# Patient Record
Sex: Male | Born: 1945 | Race: White | Hispanic: No | Smoking: Current every day smoker
Health system: Western US, Community
[De-identification: ages and names within clinical notes are randomized; demographics above are authoritative.]

## PROBLEM LIST (undated history)

## (undated) LAB — HM COLONOSCOPY

## (undated) LAB — HM PSA

---

## 2013-03-11 ENCOUNTER — Inpatient Hospital Stay: Payer: MEDICARE | Attending: DO | Primary: Family

## 2013-03-11 DIAGNOSIS — E119 Type 2 diabetes mellitus without complications: Secondary | ICD-10-CM

## 2013-03-11 LAB — CBC WITH AUTO DIFFERENTIAL
Basophils %: 1 % (ref 0–2)
Basophils, Absolute: 0.1 10*3/uL (ref 0–0.2)
Eosinophils %: 1 % (ref 0–7)
Eosinophils, Absolute: 0.1 10*3/uL (ref 0–0.7)
HCT: 46.5 % (ref 42.0–54.0)
Hgb: 15.9 gm/dL (ref 14.0–18.0)
Lymphocytes %: 42 % (ref 25–45)
Lymphocytes, Absolute: 4 10*3/uL (ref 1.1–4.3)
MCH: 30.5 pg (ref 27.0–34.0)
MCHC: 34.2 gm/dL (ref 32.0–36.0)
MCV: 89.2 fL (ref 81.0–99.0)
MPV: 9.3 fL (ref 7.4–10.4)
Monocytes %: 8 % (ref 0–12)
Monocytes, Absolute: 0.8 10*3/uL (ref 0–1.2)
Neutrophils, Absolute: 4.6 10*3/uL (ref 1.6–7.3)
Platelet Count: 146 10*3/uL — ABNORMAL LOW (ref 150–400)
RBC: 5.21 10*6/uL (ref 4.70–6.10)
RDW: 13.5 % (ref 11.5–14.5)
Segs (Neutrophils)%: 48 % (ref 35–70)
WBC: 9.5 10*3/uL (ref 4.8–10.8)

## 2013-03-11 LAB — COMPREHENSIVE METABOLIC PANEL
ALT - Alanine Amino transferase: 17 IU/L (ref 5–35)
AST - Aspartate Aminotransferase: 17 IU/L (ref 5–37)
Albumin/Globulin Ratio: 2 (ref >0.9)
Albumin: 4.5 gm/dL (ref 3.5–5.5)
Alkaline Phosphatase: 39 IU/L (ref 39–117)
Anion Gap: 7.5 mmol/L (ref 3–11)
BUN: 18 mg/dL (ref 6–26)
Bilirubin, Total: 0.7 mg/dL (ref 0.2–1.2)
CO2 - Carbon Dioxide: 28.5 mmol/L (ref 22–34)
Calcium: 9.9 mg/dL (ref 8.5–10.5)
Chloride: 103 mmol/L (ref 99–111)
Creatinine, Serum: 1.06 mg/dL (ref 0.60–1.30)
GFR Estimate: >60 mL/min/{1.73_m2} (ref >60)
Globulin: 2.3 gm/dL (ref 2.2–3.7)
Glucose: 125 mg/dL — ABNORMAL HIGH (ref 80–99)
Potassium: 4.8 mmol/L (ref 3.5–5.3)
Protein, Total: 6.8 gm/dL (ref 6.0–8.0)
Sodium: 139 mmol/L (ref 136–148)

## 2013-03-11 LAB — CORONARY RISK LIPID PANEL REFLEX DIRECT LDL
Cholesterol (CRISK): 110 mg/dL (ref <200)
Cholesterol, HDL: 38 mg/dL — ABNORMAL LOW (ref 40–60)
LDL Calculated: 54 mg/dL (ref <100)
Triglycerides (Crisk): 91 mg/dL (ref <150)

## 2013-03-11 LAB — PSA, TOTAL SCREENING: PSA Screening (Hybritech): 0.88 ng/mL (ref 0.00–4.00)

## 2013-04-10 ENCOUNTER — Inpatient Hospital Stay: Payer: MEDICARE | Attending: DO | Primary: Family

## 2013-04-10 DIAGNOSIS — E785 Hyperlipidemia, unspecified: Secondary | ICD-10-CM

## 2013-04-10 LAB — GLYCO-HEMOGLOBIN A1C
Estimated Average Glucose: 134 mg/dL
Hemoglobin A1C: 6.3 % — ABNORMAL HIGH (ref 4.3–6.1)

## 2013-04-10 LAB — 25-OH HYDROXY VITAMIN D, CALCIFEROL, TOTAL OF D2 & D3: Vit D, 25-Dihydroxy D2: 31 ng/mL (ref 30–100)

## 2013-04-10 LAB — TSH: TSH - Thyroid Stimulating Hormone: 1.58 microIU/mL (ref 0.34–5.60)

## 2013-04-17 ENCOUNTER — Inpatient Hospital Stay: Admit: 2013-04-17 | Payer: MEDICARE | Attending: DO | Primary: Family

## 2013-04-17 DIAGNOSIS — I509 Heart failure, unspecified: Secondary | ICD-10-CM

## 2013-04-22 ENCOUNTER — Inpatient Hospital Stay: Admit: 2013-04-22 | Payer: MEDICARE | Attending: DO | Primary: Family

## 2013-04-22 DIAGNOSIS — I209 Angina pectoris, unspecified: Secondary | ICD-10-CM

## 2013-04-22 MED ORDER — regadenoson (LEXISCAN) syringe 0.4 mg
0.4 | Freq: Once | INTRAVENOUS | Status: AC
Start: 2013-04-22 — End: 2013-04-23
  Administered 2013-04-23: 15:00:00 0.4 mg via INTRAVENOUS

## 2013-04-22 MED ORDER — technetium TC 99M sestamibi (CARDIOLITE) injection 26.6 milli Curie
Freq: Once | Status: AC
Start: 2013-04-22 — End: 2013-04-22
  Administered 2013-04-22: 15:00:00 via INTRAVENOUS

## 2013-04-22 MED FILL — LEXISCAN 0.4 MG/5 ML INTRAVENOUS SYRINGE: 0.4 | INTRAVENOUS | Qty: 5 | Fill #0

## 2013-04-23 ENCOUNTER — Inpatient Hospital Stay: Payer: MEDICARE | Attending: DO | Primary: Family

## 2013-04-23 MED ORDER — technetium TC 99M sestamibi (CARDIOLITE) injection 26.4 milli Curie
Freq: Once | Status: AC
Start: 2013-04-23 — End: 2013-04-23
  Administered 2013-04-23: 15:00:00 via INTRAVENOUS

## 2013-04-24 ENCOUNTER — Inpatient Hospital Stay: Payer: MEDICARE | Attending: DO | Primary: Family

## 2013-08-26 DIAGNOSIS — E785 Hyperlipidemia, unspecified: Secondary | ICD-10-CM

## 2013-08-26 LAB — GLYCO-HEMOGLOBIN A1C
Estimated Average Glucose: 140 mg/dL
Hemoglobin A1C: 6.5 % — ABNORMAL HIGH (ref 4.3–6.1)

## 2013-08-27 ENCOUNTER — Inpatient Hospital Stay: Payer: MEDICARE | Attending: DO | Primary: Family

## 2015-05-11 ENCOUNTER — Other Ambulatory Visit: Admit: 2015-05-11 | Discharge: 2015-05-11 | Payer: MEDICARE | Primary: Family

## 2015-05-11 DIAGNOSIS — I2510 Atherosclerotic heart disease of native coronary artery without angina pectoris: Secondary | ICD-10-CM

## 2015-05-11 LAB — BASIC METABOLIC PANEL
Anion Gap: 8.6 mmol/L (ref 3–11)
BUN: 14 mg/dL (ref 6.0–23.0)
CO2 - Carbon Dioxide: 25.4 mmol/L (ref 21–31)
Calcium: 9.6 mg/dL (ref 8.6–10.3)
Chloride: 103 mmol/L (ref 98–111)
Creatinine, Serum: 1.11 mg/dL (ref 0.65–1.30)
GFR Estimate: >60 mL/min/{1.73_m2} (ref >60)
Glucose: 130 mg/dL — ABNORMAL HIGH (ref 80–99)
Potassium: 3.9 mmol/L (ref 3.5–5.1)
Sodium: 137 mmol/L (ref 135–143)

## 2015-05-11 LAB — CBC WITH AUTO DIFFERENTIAL
Basophils %: 1 % (ref 0–2)
Basophils, Absolute: 0.1 10*3/uL (ref 0–0.2)
Eosinophils %: 1 % (ref 0–7)
Eosinophils, Absolute: 0.1 10*3/uL (ref 0–0.7)
HCT: 46 % (ref 42.0–54.0)
Hgb: 15.8 gm/dL (ref 12.0–18.0)
Lymphocytes %: 40 % (ref 25–45)
Lymphocytes, Absolute: 3.9 10*3/uL (ref 1.1–4.3)
MCH: 30.2 pg (ref 27–34)
MCHC: 34.2 gm/dL (ref 32–36)
MCV: 88.3 fL (ref 81–99)
MPV: 9.2 fL (ref 7.4–10.4)
Monocytes %: 7 % (ref 0–12)
Monocytes, Absolute: 0.7 10*3/uL (ref 0–1.2)
Neutrophils, Absolute: 5 10*3/uL (ref 1.6–7.3)
Platelet Count: 167 10*3/uL (ref 150–400)
RBC: 5.21 10*6/uL (ref 4.70–6.10)
RDW: 13.3 % (ref 11.5–14.5)
Segs (Neutrophils)%: 51 % (ref 35–70)
WBC: 9.7 10*3/uL (ref 4.8–10.8)

## 2015-05-12 NOTE — H&P (Signed)
[name], [name]    Date of visit:  05/11/2015  DOB:  1946/06/17    Age:  69 yrs.  Medical record number:  [id number]      Account number:  [id number]    ____________________________  CLINICAL RESUME  1.  Coronary artery disease.        a.  Non-ST elevation myocardial infarction, December 2007, at the [facility] with cardiac catheterization showing 90% lesion in the mid left circumflex, treated with 2.5 x 16 mm Taxus stent, on clopidogrel until 2014.       b.  Myocardial perfusion study, April 23, 2013, normal perfusion with ejection fraction  of 66% and septal hypokinesis, normal TID ratio.       c.  Transthoracic echocardiogram performed April 17, 2013 showing concentric left ventricular hypertrophy with left ventricular ejection fraction of 70%.       d.  Transthoracic echocardiogram performed February 19, 2015 at [facility] showing normal left ventricular systolic function, LVEF 65%, asymmetric septal hypertrophy with a septal wall thickness of 1.5 and a posterior wall thickness of 1.3.  2.  Hyperlipidemia, on rosuvastatin.  3.  Tobacco abuse.  4.  Hypertension, uncontrolled.  5.  Gastroesophageal reflux disease.  6.  Diabetes mellitus.  ____________________________  ALLERGIES   IVP Dye, Iodine Containing, Intolerance-unknown  ____________________________  MEDICATIONS   1. Nitroglycerin 0.4 Mg Sublingual Tablet, as needed, for chest pain x3 doses   2. Amlodipine 10 Mg Tablet, 1 by mouth daily   3. Losartan 100 Mg Tablet, 1 by mouth daily   4. aspirin 81 mg tablet,delayed release, 1 by mouth daily   5. metformin 1,000 mg tablet, 1 by mouth twice daily   6. pantoprazole 40 mg tablet,delayed release, 1 by mouth daily   7. rosuvastatin 20 mg tablet, 1 by mouth in the evening   8. prednisone 20 mg tablet, Take 60mg  the night before and morning of procedure.  ____________________________  CHIEF COMPLAINTS   Followup of typical angina  ____________________________  HISTORY OF PRESENT ILLNESS  I had the pleasure of seeing  Charles [name] back in clinic today.  As you know, he is a pleasant 69 year old car enthusiast from [facility], who is a retired IT trainer and presents for evaluation of coronary artery disease and accelerating angina.  I last saw him on October 29, 2014 at which time he was having no symptoms of chest pain.  He was having significant symptoms of fatigue and had been taken off of his metoprolol.  His blood pressure was under good control of which hypertension had resulted in chest pain previously.    He called into our clinic on August 18th as he was having exertional chest pain and shortness of breath.  His symptoms come on with walking one to one and a half minutes.  He has had to moderate all of his activities due to the discomfort.  His symptoms are very similar to what he had back in December of 2007 at which time he had a 90% lesion in his mid left circumflex which was treated with a drug-eluting stent.    He has a prior history of IV contrast allergy.  He states that during his first cath back in 2007, he had an episode of hypotension during the procedure; however, it was unclear whether this was contrast related.  He had two subsequent cardiac catheterizations, both of which did not result in the same symptoms.  He is not clear if he was premedicated or not.  He continues to smoke one pack per day.  His blood pressure has continued to be mildly elevated on amlodipine and losartan.  He was told on July 15th to begin taking carvedilol; however, he never started this medication.  ____________________________  PAST HISTORY   Past Medical Illnesses:  DM, hyperlipidemia, hypertension-benign, GERD, stroke;  Cardiovascular Illnesses:  chest pain, MI;  Surgical Procedures:  tonsillectomy, eye surgery;  Cardiology Procedures-Invasive:  Stent;  Left Ventricular Ejection Fraction:  LVEF of 65% documented via echocardiogram on 02/19/2015  ;       _____________________________  FAMILY HISTORY  Father -- Myocardial  infarction at less than 60 (Hx of stroke, died of MI)  Mother -- Heart disorder (pace maker implanted)  ____________________________  CARDIAC RISK FACTORS   Tobacco Abuse: positive, currently smoking;  Family History of Heart Disease: positive;  Hyperlipidemia: positive;  Hypertension: positive;  Diabetes Mellitus: positive;  Prior History of Heart Disease: positive;  Obesity: negative, BMI<25 (Normal);  Sedentary Life Style: sedentary lifestyle;  Age: positive;  Menopausal: negative  ____________________________  SOCIAL HISTORY  Alcohol Use:  denies drinking;  Smoking:  smokes 1 ppd for 52 yrs;  Diet:  caffeine use 2-3 per day and diabetic diet;  Lifestyle:  divorced;  Exercise:  no regular exercise;  Occupation:  retired enjoys fishing;  Illicit Drug Use:  denies substance abuse;  Residence:  lives with male partner;    ____________________________  REVIEW OF SYSTEMS  General:  fatigue  Integumentary:  denies recent rashes.  Eyes:  denies new blind spots, double vision.  Ears, Nose, Throat, Mouth:  denies difficulty swallowing, nosebleeds.  Respiratory:  denies dry tickling cough, wheezing.  Cardiovascular:  please review HPI  Abdominal:  denies nausea, vomiting, abdominal pain, blood in stools.  Genitourinary-Male:  difficulty with erections  Musculoskeletal:  muscle aches  Neurological:  headaches, weakness  Psychiatric:  denies any recent history of depression.  Endocrine:  denies polydipsia.  Hematological/Immunologic:  denies excessive bleeding or bruising.  ____________________________  PHYSICAL EXAMINATION    VITAL SIGNS   Blood Pressure:    144/86 Sitting, Left arm, regular cuff    132/80 Standing, Left arm, regular cuff     Pulse:  103/min.   Weight:  187.00 lbs.   Height:  70   BMI: 27      Constitutional:  alert, oriented  Skin:  warm and dry to touch  Head:  normocephalic  Eyes:  EOMS Intact, conjunctivae and lids unremarkable  ENT:  ears unremarkable, throat clear  Neck:  no JVD  Chest:   Bilaterally clear to auscultation. No crackles or wheezing.  Cardiac:  regular rhythm, S1 normal, S2 normal, No S3 or S4, apical impulse not displaced, no murmurs, no rubs detected  Abdomen:  abdomen soft, bowel sounds normoactive, no masses, non-tender, no bruits  Peripheral Pulses:  pulses full and equal in all extremities  Extremities & Back:  no clubbing, no cyanosis, no erythema, no edema, normal muscle strength and tone  Psychiatric:  appropriate mood, memory and judgment  Neurological:  no gross motor or sensory deficits noted  ____________________________  DIAGNOSTIC STUDIES  EKG performed today and reviewed by myself shows normal sinus rhythm with a heart rate of 103.  There are no pathologic Q-waves, no ST or T-wave changes.  Laboratory studies performed March 27, 2015, at the [place], total cholesterol 133, HDL 45, LDL 69, triglycerides 95.  ____________________________  IMPRESSIONS/PLAN  1.  Accelerating angina with a history of coronary artery disease.  Given that he is at high risk for recurrent coronary obstruction, I believe proceeding with a coronary angiogram is preferable to performing cardiovascular stress testing.  He plans to perform the procedure in [place] through the right wrist later this week.  If this confirms coronary obstruction then plan for percutaneous revascularization.  He will continue on aspirin, losartan and rosuvastatin.  He also was recommended to restart his metoprolol.  He was previously on 50 mg twice daily; he should start on 25 mg twice daily.  He was given instructions for presenting to the emergency room should he have chest pain which is nonresolving with nitroglycerin.  2.  Hypertension, borderline elevated today.  Recommend continuation of his amlodipine and losartan with re-addition of metoprolol.  3.  Hyperlipidemia.  Continue simvastatin.  4.  Tobacco abuse.  Recommend cessation.    It was a pleasure to see Charles [name] in clinic today.  If you have further questions  or concerns, please do not hesitate to contact me.  ____________________________  Christianne Dolin  Allergy Review  Medications Reconciliation                           Referring Physician: Homero Fellers, DAVID        Glendale Chard, MD, [facility]  NF / ieep      This H&P Note was originally documented in a third party system and entered into Epic by Collier Salina on behalf of Glendale Chard, MD of [facility].

## 2015-05-14 LAB — PROTIME-INR
INR: 1.1 (ref 0.9–1.2)
Protime: 12.4 s (ref 10.0–13.0)

## 2015-05-14 LAB — POCT GLUCOSE: POC Glucose: 183 mg/dL — ABNORMAL HIGH (ref 80–99)

## 2015-05-14 MED ORDER — midazolam (VERSED) injection
1 | INTRAMUSCULAR | Status: AC | PRN
Start: 2015-05-14 — End: 2015-05-14
  Administered 2015-05-14: 19:00:00 1 mg/mL via INTRAVENOUS

## 2015-05-14 MED ORDER — acetaminophen (TYLENOL) tablet 650 mg
325 | ORAL | Status: DC | PRN
Start: 2015-05-14 — End: 2015-05-14

## 2015-05-14 MED ORDER — sodium chloride (NS) 0.9% bolus 250 mL
INTRAVENOUS | Status: DC | PRN
Start: 2015-05-14 — End: 2015-05-14

## 2015-05-14 MED ORDER — metoprolol (LOPRESSOR) 50 MG tablet
50 | ORAL_TABLET | Freq: Two times a day (BID) | ORAL | 1.00 refills | 90.00000 days | Status: DC
Start: 2015-05-14 — End: 2015-11-27

## 2015-05-14 MED ORDER — sodium chloride 0.9 % (NS) syringe 5 mL
Freq: Three times a day (TID) | INTRAMUSCULAR | Status: DC
Start: 2015-05-14 — End: 2015-05-14

## 2015-05-14 MED ORDER — fentaNYL (PF) (SUBLIMAZE) 50 mcg/mL injection
50 | INTRAMUSCULAR | Status: AC | PRN
Start: 2015-05-14 — End: 2015-05-14
  Administered 2015-05-14: 19:00:00 50 mcg/mL via INTRAVENOUS

## 2015-05-14 MED ORDER — heparin in NS infusion 2000 unit/1000 mL
2000 | INTRAVENOUS | Status: AC | PRN
Start: 2015-05-14 — End: 2015-05-14
  Administered 2015-05-14: 19:00:00

## 2015-05-14 MED ORDER — midazolam (PF) (VERSED) 1 mg/mL injection
1 | INTRAMUSCULAR | Status: AC
Start: 2015-05-14 — End: ?

## 2015-05-14 MED ORDER — diphenhydrAMINE (BENADRYL) capsule 25 mg
25 | ORAL | Status: DC
Start: 2015-05-14 — End: 2015-05-14

## 2015-05-14 MED ORDER — lidocaine (PF) 1 % (XYLOCAINE) injection
10 | INTRAMUSCULAR | Status: AC | PRN
Start: 2015-05-14 — End: 2015-05-14
  Administered 2015-05-14: 19:00:00 10 mg/mL (1 %) via SUBCUTANEOUS

## 2015-05-14 MED ORDER — heparin (porcine) 1,000 unit/mL injection
1000 | INTRAMUSCULAR | Status: AC
Start: 2015-05-14 — End: ?

## 2015-05-14 MED ORDER — heparin (porcine) injection
1000 | INTRAMUSCULAR | Status: AC | PRN
Start: 2015-05-14 — End: 2015-05-14
  Administered 2015-05-14: 19:00:00 via INTRAVENOUS

## 2015-05-14 MED ORDER — verapamil (ISOPTIN) 2.5 mg/mL injection
2.5 | INTRAVENOUS | Status: AC
Start: 2015-05-14 — End: ?

## 2015-05-14 MED ORDER — losartan (COZAAR) 100 MG tablet
100 | ORAL_TABLET | Freq: Every day | ORAL | Status: DC
Start: 2015-05-14 — End: 2015-11-27

## 2015-05-14 MED ORDER — iodixanol (VISIPAQUE) 320 mg iodine/mL injection
320 | INTRAVENOUS | Status: AC | PRN
Start: 2015-05-14 — End: 2015-05-14
  Administered 2015-05-14: 19:00:00 320 mg iodine/mL via INTRAVENOUS

## 2015-05-14 MED ORDER — nitroglycerin syringe 2 mg/10mL (200 mcg/mL)
200 | INTRAVENOUS | Status: AC | PRN
Start: 2015-05-14 — End: 2015-05-14
  Administered 2015-05-14: 19:00:00 2000 mcg/10 mL via INTRAVENOUS

## 2015-05-14 MED ORDER — sodium chloride 0.9 % (NS) syringe 5 mL
INTRAMUSCULAR | Status: DC | PRN
Start: 2015-05-14 — End: 2015-05-14

## 2015-05-14 MED ORDER — isosorbide mononitrate (IMDUR) 30 MG 24 hr tablet
30 | ORAL_TABLET | Freq: Every day | ORAL | 1.00 refills | 90.00000 days | Status: DC
Start: 2015-05-14 — End: 2015-11-27

## 2015-05-14 MED ORDER — nitroglycerin (NITROSTAT) SL tablet 0.4 mg
0.4 | SUBLINGUAL | Status: DC | PRN
Start: 2015-05-14 — End: 2015-05-14

## 2015-05-14 MED ORDER — heparin in NS infusion 2 unit/mL
1000 | INTRAVENOUS | Status: AC | PRN
Start: 2015-05-14 — End: 2015-05-14
  Administered 2015-05-14: 19:00:00

## 2015-05-14 MED ORDER — fentaNYL (PF) (SUBLIMAZE) 50 mcg/mL injection
50 | INTRAMUSCULAR | Status: AC
Start: 2015-05-14 — End: ?

## 2015-05-14 MED ORDER — aspirin 81 MG chewable tablet 81 mg
81 | Freq: Every day | ORAL | Status: DC
Start: 2015-05-14 — End: 2015-05-14
  Administered 2015-05-14: 19:00:00 81 mg via ORAL

## 2015-05-14 MED ORDER — atropine syringe 0.5 mg
0.1 | INTRAMUSCULAR | Status: DC | PRN
Start: 2015-05-14 — End: 2015-05-14

## 2015-05-14 MED ORDER — nitroglycerin (TRIDIL) 50 mg/250 mL (200 mcg/mL) infusion
50 | INTRAVENOUS | Status: AC
Start: 2015-05-14 — End: ?

## 2015-05-14 MED ORDER — HYDROcodone-acetaminophen (NORCO) 5-325 mg per tablet 1-2 tablet
5-325 | ORAL | Status: DC | PRN
Start: 2015-05-14 — End: 2015-05-14

## 2015-05-14 MED FILL — NITROGLYCERIN 50 MG/250 ML (200 MCG/ML) IN 5 % DEXTROSE INTRAVENOUS: 50 mg/250 mL (200 mcg/mL) | INTRAVENOUS | Qty: 250

## 2015-05-14 MED FILL — MIDAZOLAM (PF) 1 MG/ML INJECTION SOLUTION: 1 mg/mL | INTRAMUSCULAR | Qty: 1

## 2015-05-14 MED FILL — FENTANYL (PF) 50 MCG/ML INJECTION SOLUTION: 50 ug/mL | INTRAMUSCULAR | Qty: 2

## 2015-05-14 MED FILL — HEPARIN (PORCINE) 1,000 UNIT/ML INJECTION VIAL: 1000 [IU]/mL | INTRAMUSCULAR | Qty: 1

## 2015-05-14 MED FILL — VERAPAMIL 2.5 MG/ML INTRAVENOUS SOLUTION: 2.5 mg/mL | INTRAVENOUS | Qty: 2

## 2015-05-14 MED FILL — ASPIRIN 81 MG CHEWABLE TABLET: 81 mg | ORAL | Qty: 1

## 2015-05-14 NOTE — Progress Notes (Signed)
Patient is tolerating lunch meal well.  No complaints of nausea or vomiting.  Right radial site continues to be intact and dry with no new signs or symptoms of bleeding.  Denies pain.

## 2015-05-14 NOTE — Progress Notes (Signed)
Patient down to procedure on monitor transported by Rossie Muskrat., RN.  In no apparent respiratory distress.  Able to converse appropriately and follows simple commands.

## 2015-05-14 NOTE — Progress Notes (Signed)
Patient given post-procedure instructions with significant other, [name], at bedside. Both verbalized understanding of instructions with teach back and state they have no further questions. AVS packet given to patient and [name] to review.

## 2015-05-14 NOTE — Brief Op Note (Signed)
Cardiology Brief Operative Note    Performing Cardiologist and Assistants:  Nurse - Registered Nurse  Tech - Scrub Tech  Tech - Monitor Cath Lab  Physician - Cardiologist  Physician - Cardiologist  Nurse - Registered Nurse  [facility] - Monitor Cath Lab  Tech - Scrub Tech [place], [place] A.  Luna Kitchens, Annastasia  [place], [address]  [place], Harrold Donath  [place], Shawn A.  Malen Gauze, Franklyn Lor  Ebony Cargo     Pre-Op Diagnosis: Coronary Artery Disease (C.A.D.) and Chest Pain    Post-Op Diagnosis:  Coronary Artery Disease (C.A.D.)    Procedure Performed:  Left Heart Catheterization and Coronary Angiography    Specimen(s) Removed:  None    Estimated Blood Loss: Less than 30 cc    Anesthesia Type:: None    Implant:: None    Findings:  80% D2 small vessel, OM1 70%, RPDA mid 70%, patent mid LCX stent, LVEF 80%    Complications:  None    Disposition:  CVR - In stable condition

## 2015-05-14 NOTE — Progress Notes (Signed)
Patient resting quietly in bed.  No signs or symptoms of active bleeding at compression site.

## 2015-05-14 NOTE — Other (Signed)
SBAR report given to marian rn

## 2015-05-14 NOTE — Discharge Instructions (Signed)
Coronary Angiogram  A coronary angiogram, also called coronary angiography, is an X-ray procedure used to look at the arteries in the heart. In this procedure, a dye (contrast dye) is injected through a long, hollow tube (catheter). The catheter is about the size of a piece of cooked spaghetti and is inserted through your groin, wrist, or arm. The dye is injected into each artery, and X-rays are then taken to show if there is a blockage in the arteries of your heart.  LET [facility] CARE PROVIDER KNOW ABOUT:  ? Any allergies you have, including allergies to shellfish or contrast dye. ?  ? All medicines you are taking, including vitamins, herbs, eye drops, creams, and over-the-counter medicines. ?  ? Previous problems you or members of your family have had with the use of anesthetics. ?  ? Any blood disorders you have. ?  ? Previous surgeries you have had.  ? History of kidney problems or failure. ?  ? Other medical conditions you have.  RISKS AND COMPLICATIONS   Generally, a coronary angiogram is a safe procedure. However, problems can occur and include:  ? Allergic reaction to the dye.  ? Bleeding from the access site or other locations.  ? Kidney injury, especially in people with impaired kidney function.?  ? Stroke (rare).  ? Heart attack (rare).  BEFORE THE PROCEDURE   ? Do not eat or drink anything after midnight the night before the procedure or as directed by your health care provider. ?  ? Ask your health care provider about changing or stopping your regular medicines. This is especially important if you are taking diabetes medicines or blood thinners.  PROCEDURE  ? You may be given a medicine to help you relax (sedative) before the procedure. This medicine is given through an intravenous (IV) access tube that is inserted into one of your veins. ?  ? The area where the catheter will be inserted will be washed and shaved. This is usually done in the groin but may be done in the fold of your arm (near your  elbow) or in the wrist. ??  ? A medicine will be given to numb the area where the catheter will be inserted (local anesthetic). ?  ? The health care provider will insert the catheter into an artery. The catheter will be guided by using a special type of X-ray (fluoroscopy) of the blood vessel being examined. ?  ? A special dye will then be injected into the catheter, and X-rays will be taken. The dye will help to show where any narrowing or blockages are located in the heart arteries. ?  AFTER THE PROCEDURE   ? If the procedure is done through the leg, you will be kept in bed lying flat for several hours. You will be instructed to not bend or cross your legs.  ? The insertion site will be checked frequently. ?  ? The pulse in your feet or wrist will be checked frequently. ?  ? Additional blood tests, X-rays, and an electrocardiogram may be done. ?    - Do not lift anything heavier than a grocery bag for several days.  - No heavy use of right arm/wrist for several days.  - Seek medical attention if heavy bleeding of the site occurs.  - Seek medical attention if any signs of infection, such as oozing from the site or fever/chills  Document Released: 03/12/2003 Document Revised: 01/20/2014 Document Reviewed: 01/28/2013  ExitCare? Patient Information ?2015 [employer]. This  information is not intended to replace advice given to you by your health care provider. Make sure you discuss any questions you have with your health care provider.

## 2015-08-27 ENCOUNTER — Ambulatory Visit: Admit: 2015-08-27 | Discharge: 2015-08-27 | Payer: TRICARE (CHAMPUS) | Attending: MD | Primary: Family

## 2015-08-27 DIAGNOSIS — Z6826 Body mass index (BMI) 26.0-26.9, adult: Secondary | ICD-10-CM

## 2015-08-27 NOTE — Progress Notes (Deleted)
Charles [name] is a 69 y.o. male.   No chief complaint on file.      HISTORY OF PRESENT ILLNESS   HPI  ***    No problems updated.  Past Medical History   Diagnosis Date   . Angina at rest    . Arrhythmia      years ago   . Bronchitis    . Coronary artery disease    . Diabetes mellitus 2013   . GERD (gastroesophageal reflux disease)      for years   . Hyperlipidemia    . Hypertension 2006   . NSTEMI (non-ST elevated myocardial infarction) 08/2006 with stent mid left circumflex   . Stroke 1980's     Past Surgical History   Procedure Laterality Date   . Coronary stent placement Left mid circumflex     08/2006   . Cardiac catheterization     . Eye surger       Tear duct surgery   . Tonsillectomy       as a child     Allergies   Allergen Reactions   . Gabapentin Other (See Comments)     Yellowing of skin.   . Iodine And Iodide Containing Products Anaphylaxis     CONTRAST DYE used years ago for heart cath     Social History     Social History Main Topics   . Smoking status: Current Every Day Smoker     Packs/day: 1.00     Years: 52.00   . Smokeless tobacco: Former Neurosurgeon      Comment: Dr. Alfred Levins discussed smoking cessation with visit.   Marland Kitchen Alcohol use No   . Drug use: No   . Sexual activity: Yes     Partners: Female     Social History   . Marital status: Significant Other     Spouse name: N/A   . Number of children: 3   . Years of education: N/A     Other Topics Concern   . Exercise Yes     a little     Family History   Problem Relation Age of Onset   . Heart attack Mother    . Heart disease Mother    . Stroke Mother    . Heart disease Father          REVIEW OF SYSTEMS   Review of Systems    PHYSICAL EXAM   There were no vitals taken for this visit.  There is no height or weight on file to calculate BMI.  Physical Exam      ASSESSMENT & PLAN     Problem List Items Addressed This Visit     None      Visit Diagnoses     BMI 26.0-26.9,adult    -  Primary            Current Outpatient Prescriptions:   .  amLODIPine (NORVASC)  10 MG tablet, Take 10 mg by mouth daily., Disp: , Rfl:   .  aspirin 81 MG EC tablet, Take 81 mg by mouth daily., Disp: , Rfl:   .  diphenhydrAMINE (BENADRYL) 50 MG capsule, Take 50 mg by mouth daily. Take night before procedure, Disp: , Rfl:   .  isosorbide mononitrate (IMDUR) 30 MG 24 hr tablet, Take 1 tablet (30 mg total) by mouth daily., Disp: 30 tablet, Rfl: 11  .  losartan (COZAAR) 100 MG tablet, Take 1 tablet (100 mg total) by mouth  daily., Disp: 30 tablet, Rfl: 11  .  metFORMIN (GLUCOPHAGE) 1000 MG tablet, Take 1,000 mg by mouth 2 (two) times daily with meals., Disp: , Rfl:   .  metoprolol (LOPRESSOR) 50 MG tablet, Take 1 tablet (50 mg total) by mouth 2 (two) times daily., Disp: 60 tablet, Rfl: 11  .  nitroglycerin (NITROSTAT) 0.4 MG SL tablet, Place 0.4 mg under the tongue every 5 (five) minutes as needed for Chest pain., Disp: , Rfl:   .  pantoprazole (PROTONIX) 40 MG tablet, Take 40 mg by mouth daily., Disp: , Rfl:   .  rosuvastatin (CRESTOR) 20 MG tablet, Take 20 mg by mouth nightly. , Disp: , Rfl:     This note was scribed by *** on behalf of Gerrit Heck, MD and all contents have been reviewed prior to signature of the progress note.
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
## 2015-09-16 ENCOUNTER — Encounter: Admit: 2015-09-16 | Discharge: 2015-09-17 | Payer: MEDICARE | Primary: Family

## 2015-09-16 ENCOUNTER — Ambulatory Visit: Admit: 2015-09-16 | Discharge: 2015-09-17 | Payer: TRICARE (CHAMPUS) | Attending: Adult Health | Primary: Family

## 2015-09-16 DIAGNOSIS — J189 Pneumonia, unspecified organism: Secondary | ICD-10-CM

## 2015-09-16 MED ORDER — ipratropium-albuterol (DUO-NEB) 0.5 mg-3 mg(2.5 mg base)/3 mL nebulizer solution 3 mL
0.5 | Freq: Once | RESPIRATORY_TRACT | Status: AC
Start: 2015-09-16 — End: 2015-09-16
  Administered 2015-09-16: 23:00:00 0.5 mL via RESPIRATORY_TRACT

## 2015-09-16 MED ORDER — levoFLOXacin (LEVAQUIN) 500 MG tablet
500 | ORAL_TABLET | Freq: Every day | ORAL | 0 refills | 5.00000 days | Status: DC
Start: 2015-09-16 — End: 2015-09-23

## 2015-09-16 MED ORDER — methylPREDNISolone (MEDROL DOSEPACK) 4 mg tablet
4 | ORAL_TABLET | ORAL | 0 refills | Status: DC
Start: 2015-09-16 — End: 2015-09-23

## 2015-09-16 NOTE — Telephone Encounter (Signed)
Patient is calling and states he feels like he is only breathing out of the top half of his lungs. Patient states he feels like hew has fluid in his lungs. He says he is having flu-like symptoms. Breathing is short and rapid and said you threw up and it was almost all nasal discharge. Patient says these symptoms started on Monday 12/26. Please call back at [phone]

## 2015-09-16 NOTE — Telephone Encounter (Signed)
Pt states he is SOB, has a dry cough, feels like he has fluid in his lungs and that he is only breathing out of the top of his lungs x 2 days. He said he may have a fever as well. A loose cough is heard over the phone. He denies chest pain, pedal edema, no hx of CHF, no Rx'd diuretics. He speaks in full sentences however does appear slightly SOB. He would like to be seen. Dr. Miquel Dunn is booked. Scheduled pt to see Pamella Pert DNP at 1500 and instructed him to check in at 1455. He stated understanding and agreement with this. Discussed with Pamella Pert and closing encounter.

## 2015-09-16 NOTE — Progress Notes (Addendum)
Charles [name] is a 69 y.o. male.  Chief Complaint   Charles [name] presents with   . Cough     with SOB, and congestion x3 days         SUBJECTIVE     Chief Complaint   Charles [name] presents with   . Cough     with SOB, and congestion x3 days       HISTORY OF PRESENT ILLNESS     HPI      Cough  This is a acute problem that started about 3 day(s) ago. Charles [name] reports that this problem is constant. Charles [name] states that this problem has worsened since its onset. She states that Charles cough is not productive. Cough is made worse by being too warm. Charles [name] reports having tried turning his heating pad down helps a little. Attempted treatments provided Musinex DM that gives him a lot of cough relief. Associated symptoms include chills, fever, postnasal drip, shortness of breath and wheezing. Charles [name] denies any: change in voice, chest pain, sputum production and weight loss.      Charles following portions of Charles Charles [name]'s history were reviewed and updated as appropriate: allergies, current medications, past family history, past medical history, past surgical histroy and problem list.    Outpatient Prescriptions Marked as Taking for Charles 09/16/15 encounter (Office Visit) with Pamella Pert, ANP   Medication Sig Dispense Refill   . amLODIPine (NORVASC) 10 MG tablet Take 10 mg by mouth daily.     Marland Kitchen aspirin 81 MG EC tablet Take 81 mg by mouth daily.     . diphenhydrAMINE (BENADRYL) 50 MG capsule Take 50 mg by mouth daily. Take night before procedure     . isosorbide mononitrate (IMDUR) 30 MG 24 hr tablet Take 1 tablet (30 mg total) by mouth daily. 30 tablet 11   . losartan (COZAAR) 100 MG tablet Take 1 tablet (100 mg total) by mouth daily. 30 tablet 11   . metFORMIN (GLUCOPHAGE) 1000 MG tablet Take 1,000 mg by mouth 2 (two) times daily with meals.     . metoprolol (LOPRESSOR) 50 MG tablet Take 1 tablet (50 mg total) by mouth 2 (two) times daily. 60 tablet 11   . nitroglycerin (NITROSTAT) 0.4 MG SL tablet Place 0.4 mg under Charles tongue every 5 (five)  minutes as needed for Chest pain.     . pantoprazole (PROTONIX) 40 MG tablet Take 40 mg by mouth daily.     . rosuvastatin (CRESTOR) 20 MG tablet Take 20 mg by mouth nightly.        Current Facility-Administered Medications for Charles 09/16/15 encounter (Office Visit) with Pamella Pert, ANP   Medication Dose Route Frequency Provider Last Rate Last Dose   . [COMPLETED] ipratropium-[name] (DUO-NEB) 0.5 mg-3 mg(2.5 mg base)/3 mL nebulizer solution 3 mL  3 mL Nebulization Once Tida Saner, ANP   3 mL at 09/16/15 1513       Past Medical History   Diagnosis Date   . Angina at rest    . Arrhythmia      years ago   . Bronchitis    . Coronary artery disease    . Diabetes mellitus 2013   . GERD (gastroesophageal reflux disease)      for years   . Hyperlipidemia    . Hypertension 2006   . NSTEMI (non-ST elevated myocardial infarction) 08/2006 with stent mid left circumflex   . Stroke 1980's       Past Surgical History   Procedure Laterality  Date   . Coronary stent placement Left mid circumflex     08/2006   . Cardiac catheterization     . Eye surger       Tear duct surgery   . Tonsillectomy       as a child       Family History   Problem Relation Age of Onset   . Heart attack Mother    . Heart disease Mother    . Stroke Mother    . Heart disease Father        Social History     Social History Main Topics   . Smoking status: Current Every Day Smoker     Packs/day: 1.00     Years: 52.00   . Smokeless tobacco: Former Neurosurgeon      Comment: Dr. Alfred Levins discussed smoking cessation with visit.   Marland Kitchen Alcohol use No   . Drug use: No   . Sexual activity: Yes     Partners: Female     Social History   . Marital status: Significant Other     Spouse name: N/A   . Number of children: 3   . Years of education: N/A     Other Topics Concern   . Exercise Yes     a little       REVIEW OF SYSTEMS     Please review HPI for clinically relevant ROS.    OBJECTIVE     Visit Vitals   . BP 102/64   . Pulse 77   . Temp 36.7 ?C (98.1 ?F) (Oral)   . Ht 5' 10 (1.778  m)   . Wt 189 lb (85.7 kg)   . SpO2 (!) 89%   . BMI 27.12 kg/m2     Body mass index is 27.12 kg/(m^2).     Nebulizer Treatment  Charles [name] had a SaO2 of 89, Peak Flow of 200 and Lung Sounds that were diminished.  After Charles [name] 2.5 mg / 3 mL vial  DuoNeb 3 mL vial via nebulizer.  After Charles [name] had a SaO2 of 92, Peak Flow of 200 and Lung Sounds that were diminished. Charles [name] improved in his oxygen saturation with treatment but not his lung volumes.      Inserted 22g IV into Right hand. Charles [name] tolerated well. No signs of infiltration noted. Infused 500L of fluid over 2 hours with 2 grams of Rocephin. Conclusion of infusion IV catheter removed patent and without complications. Charles [name] vitals signs stable.     IV start time:3:30pm  IV end time :5 :30pm    Administrations This Visit     cefTRIAXone (ROCEPHIN) 2 g in dextrose 5 % (D5W) 50 mL IVPB     Admin Date Action Dose Rate Route Site Administered By       09/16/2015  04:30 New Bag 2 g 100 mL/hr Intravenous Right Hand Willeen Niece, MA      Ordering Provider:  Pamella Pert, ANP     NDC:  [phone]     Manufacturer:  [employer]IT[employer]     Charles [name]?:  No                 ipratropium-[name] (DUO-NEB) 0.5 mg-3 mg(2.5 mg base)/3 mL nebulizer solution 3 mL     Admin Date Action Dose Route Site Administered By          09/16/2015  15:13 Given 3 mL Nebulization Mouth E[place]  [place], [place]       Ordering Provider:  Pamella Pert, ANP     NDC:  9562-1308-65     Lot#:  [id number]     Manufacturer:  [employer]     Charles [name]?:  No                 sodium chloride 0.9 % infusion     Admin Date Action Dose Rate Route Site Administered By       09/16/2015  16:10 New Bag   250 mL/hr Intravenous Left Hand Willeen Niece, MA      Ordering Provider:  Pamella Pert, ANP     NDC:  2952-8413-24     Lot#:  [id number]     Manufacturer:  [employer]     Charles [name]?:  No                           PHYSICAL EXAM     Physical Exam      Constitutional: Charles [name] is oriented to person, place, and time and well-developed, well-nourished, and in no distress.   HENT:   Head: Normocephalic and atraumatic.   Right Ear: External ear normal.   Left Ear: External ear normal.   Nose: Nose normal.   Mouth/Throat: Oropharynx is clear and moist.   Eyes: Conjunctivae and EOM are normal. Pupils are equal, round, and reactive to light.   Neck: Normal range of motion. Neck supple.   Cardiovascular: Normal rate, regular rhythm, normal heart sounds and intact distal pulses.    Pulmonary/Chest: Charles [name] is in respiratory distress. Charles [name] has wheezes.   Diminished lung sounds posteriorly, borderline pnuemonia   Abdominal: Soft. Bowel sounds are normal.   Musculoskeletal: Normal range of motion.   Neurological: Charles [name] is alert and oriented to person, place, and time. Charles [name] has normal reflexes. Gait normal. GCS score is 15.   Skin: Skin is warm and dry.   Psychiatric: Mood, memory, affect and judgment normal.   Vitals reviewed.      ASSESSMENT & PLAN     1. Pneumonia due to organism  - Nebulizer treatment  - Peak Flow Test  - Pulse Ox  - ipratropium-[name] (DUO-NEB) 0.5 mg-3 mg(2.5 mg base)/3 mL nebulizer solution 3 mL; Take 3 mLs by nebulization once.  - levoFLOXacin (LEVAQUIN) 500 MG tablet; Take 1 tablet (500 mg total) by mouth daily for 7 days.  Dispense: 7 tablet; Refill: 0  - methylPREDNISolone (MEDROL DOSEPACK) 4 mg tablet; Follow package insert instructions  Dispense: 21 tablet; Refill: 0  - X-ray chest PA and lateral; Future    2. Former smoker, stopped smoking in distant past    3. BMI 27.0-27.9,adult    4. Overweight      Return if symptoms worsen or fail to improve.    There are no discontinued medications.      ----------------------------------------------------------------------------------------------------------------------    We had a thoughtful and careful discussion regarding Charles issues today. All questions were answered. We educated and reassured. We encouraged  followup as needed.     This note was transcribed using speech recognition software. Please contact us for clarification if any questions arise relating to Charles wording of this document.    I, Pamella Pert, DNP, personally performed Charles services described in this documentation, as scribed by Willeen Niece in my presence, and it is both accurate and complete.

## 2015-09-17 MED ORDER — cefTRIAXone (ROCEPHIN) 2 g in dextrose 5 % (D5W) 50 mL IVPB
1 | Freq: Once | INTRAMUSCULAR | Status: AC
Start: 2015-09-17 — End: 2015-09-16
  Administered 2015-09-16: 13:00:00 via INTRAVENOUS

## 2015-09-17 NOTE — Addendum Note (Signed)
Addended by: Willeen Niece on: 09/17/2015 04:14 PM     Modules accepted: Orders

## 2015-09-18 LAB — SOP PEAK FLOW: SOP Peak Flow: 250 %

## 2015-09-18 MED ORDER — sodium chloride 0.9 % infusion
Freq: Once | INTRAVENOUS | Status: AC
Start: 2015-09-18 — End: 2015-09-16
  Administered 2015-09-17: via INTRAVENOUS

## 2015-09-18 NOTE — Telephone Encounter (Signed)
Please obtain and transcribe [facility] referral.

## 2015-09-23 ENCOUNTER — Emergency Department: Admit: 2015-09-24 | Primary: Family

## 2015-09-23 DIAGNOSIS — J9601 Acute respiratory failure with hypoxia: Secondary | ICD-10-CM

## 2015-09-23 NOTE — H&P (Signed)
History and Physical   Name: Charles [name]  DOB: 1946-05-11 70 y.o.  MRN: [id number]  CSN: [id number]  ADMISSION PROVIDER: Letta Kocher Mercades Bajaj, 09/23/2015  PCP: Gerrit Heck, MD    HISTORY:   CHIEF COMPLAINT    Shortness Of Breath    HPI    Charles [name] is a 70 y.o. male with a history of CAD, type 2 diabetes, GERD, hyperlipidemia, hypertension and probable COPD status post PFT done at the [place] [facility] (results unknown) who presented to the hospital because of increasing shortness of breath. He is a very good historian. [place] with his significant other, Charles [name], and normally is 100% independent. About 10 days ago the patient began having fatigue, runny nose and a cough. He was having some postnasal drainage that he vomited twice 90 ago. After those vomiting episodes, they were nonbilious, nonbloody and simply has not per the patient, he was still was not feeling well with continued chills and then diarrhea. 7 days prior to admission the patient saw his nurse practitioner who diagnosed him with pneumonia and gave him a seven-day course of Levaquin and a 6 day course of a Medrol Dosepak. Despite this regimen, the patient continued to have increasing shortness of breath. He said he feels like he can only use about half of his lungs bilaterally. He also is having some pain when he coughs but no pleurisy. He is mostly dyspneic with exertion not so much at rest but occasionally feels short of breath at rest. He did have pulmonary function tests done at the [place] but again the results are unknown. He is having a productive cough but he absolutely cannot handle spitting and therefore does not know if it isn't discolored or simply clear phlegm. He has never had any blood clots. He seems unsure about having COPD but he has smoked essentially his entire life. When he was a very young child he is exposed to heavy secondhand smoke and he began smoking at the age of 54 and  continues to do so at this time and has no intention of quitting at this time. In the emergency department he has findings consistent with COPD exacerbation, possibly a viral infection so he is being admitted for hypoxia. At rest on his bed on room air he is 88% as checked by me.    PAST MEDICAL HISTORY    Past Medical History   Diagnosis Date   . Angina at rest    . Arrhythmia      years ago, EPISODES of vtach whole life   . COPD (chronic obstructive pulmonary disease)    . Coronary artery disease    . Diabetes mellitus 2013   . GERD (gastroesophageal reflux disease)      for years   . Hyperlipidemia    . Hypertension 2006   . NSTEMI (non-ST elevated myocardial infarction) 08/2006 with stent mid left circumflex   . Stroke 1980's     had neuro changes but resolved       SURGICAL HISTORY    Past Surgical History   Procedure Laterality Date   . Coronary stent placement Left mid circumflex     08/2006   . Cardiac catheterization     . Eye surger       Tear duct surgery   . Tonsillectomy       as a child       ALLERGIES    Gabapentin and Iodine  and iodide containing products    CURRENT MEDICATIONS    Prior to Admission Medications    Medication Dose & Frequency   benzonatate (TESSALON) 100 MG capsule Take 100 mg by mouth 3 (three) times daily as needed for Cough.   amLODIPine (NORVASC) 10 MG tablet Take 10 mg by mouth daily.   aspirin 81 MG EC tablet Take 81 mg by mouth daily.   isosorbide mononitrate (IMDUR) 30 MG 24 hr tablet Take 1 tablet (30 mg total) by mouth daily.   losartan (COZAAR) 100 MG tablet Take 1 tablet (100 mg total) by mouth daily.   metFORMIN (GLUCOPHAGE) 1000 MG tablet Take 1,000 mg by mouth 2 (two) times daily with meals.   metoprolol (LOPRESSOR) 50 MG tablet Take 1 tablet (50 mg total) by mouth 2 (two) times daily.   nitroglycerin (NITROSTAT) 0.4 MG SL tablet Place 0.4 mg under the tongue every 5 (five) minutes as needed for Chest pain.   pantoprazole (PROTONIX) 40 MG tablet Take 40 mg by mouth  daily.   rosuvastatin (CRESTOR) 20 MG tablet Take 20 mg by mouth nightly.        FAMILY HISTORY    Family History   Problem Relation Age of Onset   . Heart attack Mother    . Heart disease Mother    . Stroke Mother    . Heart disease Father        SOCIAL HISTORY    Social History     Social History Narrative    Lives with wife Baldwin [place], she is healthcare surrogate     Social History   Substance Use Topics   . Smoking status: Current Every Day Smoker     Packs/day: 1.00     Years: 52.00   . Smokeless tobacco: Former Neurosurgeon      Comment: Dr. Alfred Levins discussed smoking cessation with visit.   Marland Kitchen Alcohol use No       There is no immunization history on file for this patient.    REVIEW OF SYSTEMS    Constitutional:  Denies fever, has had chills, no weight loss  Eyes:  Denies change in visual acuity   HENT:  Denies sore throat but he has been having some runny nose/congestion  Respiratory:  (+) cough with productive sputum. (+) dyspnea, wheezing  Cardiovascular:  Denies palpitaions or edema, no pleurisy  GI:  Denies abdominal pain, bloody stools. He has had diarrhea, vomiting resolved  GU:  Denies dysuria   Musculoskeletal:  Denies back pain or joint pain   Integument:  Denies rash   Neurologic:  Denies headache, focal weakness or sensory changes   Endocrine:  Denies polyuria or polydipsia   Lymphatic:  Denies swollen glands   Psychiatric:  Denies depression or anxiety     PHYSICAL EXAM:                                               VITAL SIGNS on ADMISSION   Temp Blood Pressure Heart Rate Resp Rate O2 Sats   Temp: 36.7 ?C (98.1 ?F) BP: 156/84 Pulse: 96 Resp: 16   SpO2: (!) 89 % on  L/min  MOST RECENT VITAL SIGNS   Temp Blood Pressure Heart Rate Resp Rate O2 Sats   Temp: 36.7 ?C (98.1 ?F) BP: 150/87 Pulse: 74 Resp: 16   SpO2: 94 % on  L/min      Admission Weight: Weight: 84.8 kg (187 lb)       BMI: Body mass index is 26.83 kg/(m^2).    Constitutional:  Well developed, well nourished, no  acute distress, tired  Eyes:  PERRL, conjunctiva normal, EOMI, non-icteric  HENT:  Atraumatic, oropharynx moist, no pharyngeal exudates, dentition is good  Neck- normal range of motion, no tenderness, supple   Respiratory:  No respiratory distress, decreased breath sounds bilaterally, mild inspiratory and expiratory wheezing  Cardiovascular:  Normal rate, normal rhythm, no murmur   GI:  Soft, nondistended, decreased bowel sounds, nontender, no rebound, no guarding   GU:  No costovertebral angle tenderness   Musculoskeletal:  No edema, no tenderness, no deformities. Good pedal pulses  Integument:  Well hydrated, no rash   Lymphatic:  No lymphadenopathy noted   Neurologic:  Alert & oriented x 3, CN 2-12 normal, normal motor function,  no focal deficits noted   Psychiatric:  Speech and behavior appropriate     DATA:     LABS    Results for orders placed or performed during the hospital encounter of 09/23/15 (from the past 24 hour(s))   Lactic Acid (ED) -STAT    Collection Time: 09/23/15  7:23 PM   Result Value Ref Range    Lactic Acid ED 1.4 0.5 - 2.2 mmol/L   CBC with Auto Differential -STAT    Collection Time: 09/23/15  7:24 PM   Result Value Ref Range    WBC 10.7 4.8 - 10.8 10*3/?L    RBC 5.26 4.70 - 6.10 10*6/?L    Hemoglobin 15.4 12.0 - 18.0 g/dL    HCT 16.1 09.6 - 04.5 %    MCV 87.5 81.0 - 99.0 fL    MCH 29.3 27.0 - 34.0 pg    MCHC 33.5 32.0 - 36.0 g/dL    RDW 40.9 81.1 - 91.4 %    Platelet Count 183 150 - 400 10*3/?L    MPV 8.7 7.4 - 10.4 fL    Neutrophils % 45 35 - 70 %    Lymphocytes % 46 (H) 25 - 45 %    Monocytes % 8 0 - 12 %    Eosinophils % 0 0 - 7 %    Basophils % 1 0 - 2 %    Neutrophils, Absolute 4.8 1.6 - 7.3 10*3/?L    Lymphocytes, Absolute 4.9 (H) 1.1 - 4.3 10*3/?L    Monocytes, Absolute 0.8 0.0 - 1.2 10*3/?L    Eosinophils, Absolute 0.0 0.0 - 0.7 10*3/?L    Basophils, Absolute 0.1 0.0 - 0.2 10*3/?L    Differential Type Automated Differential    Comprehensive Metabolic Panel -STAT    Collection Time:  09/23/15  7:24 PM   Result Value Ref Range    Sodium 135 135 - 143 mmol/L    Potassium 3.9 3.5 - 5.1 mmol/L    Chloride 102 98 - 111 mmol/L    CO2 - Carbon Dioxide 23.1 21.0 - 31.0 mmol/L    Glucose 186 (H) 80 - 99 mg/dL    BUN 17 6 - 23 mg/dL    Creatinine 7.82 9.56 - 1.30 mg/dL    Calcium 9.6 8.6 - 21.3 mg/dL    AST - Aspartate Aminotransferase 27 10 - 50  IU/L    ALT - Alanine Amino transferase 50 7 - 52 IU/L    Alkaline Phosphatase 55 34 - 104 IU/L    Bilirubin Total 0.4 0.3 - 1.2 mg/dL    Protein Total 6.5 6.0 - 8.0 g/dL    Albumin 4.2 3.5 - 5.0 g/dL    Globulin 2.3 2.2 - 3.7 g/dL    Albumin/Globulin Ratio 1.8 >0.9    Anion Gap 9.9 3.0 - 11.0 mmol/L    GFR Estimate >60 >=60 mL/min/1.46m*2    GFR Additional Info     Blood Culture -x 2    Collection Time: 09/23/15  7:24 PM   Result Value Ref Range    Blood Culture Result No Growth to Date    Blood Culture -x 2    Collection Time: 09/23/15  7:27 PM   Result Value Ref Range    Blood Culture Result No Growth to Date    Rapid Influenza A & B by NAAT(Stat) -STAT    Collection Time: 09/23/15  9:01 PM   Result Value Ref Range    Influenza A Influenza A Viral RNA Not Detected Influenza A Viral RNA Not Detected    Influenza B Influenza B Viral RNA Not Detected Influenza B Viral RNA Not Detected       IMAGING   X-ray chest PA and lateral   Final Result   IMPRESSION:     1.  Hyperexpanded clear lungs. Hyperexpansion may relate to chronic obstructive    pulmonary disease if patient has a history of smoking. No superimposed acute    pneumonia identified.                ASSESSMENT:   DIAGNOSIS:         *COPD with exacerbation [J44.1]      Tobacco use disorder [F17.200]            Long history of smoking; does not want to be            counseled about quitting      Type 2 diabetes mellitus treated without insulin [E11.9]            Metformin 1000mg  twice a day; A1c 6.5 (26 August 2013)      Benign essential hypertension [I10]            Amlodipine 10mg  daily, Imdur  30mg  daily,            metoprolol 50mg  twice a day, losartan 100mg  daily;            160/98 in clinic & suspect pt did not take his             medicine (27 Aug 2015)        Pertinent Labs & Imaging studies reviewed. (See chart for details)     PLAN:   See orders.  The plan was discussed with the patient and/or family.    1. Acute respiratory failure with hypoxia secondary to COPD exacerbation: It is strange he has not really improved. He had 7 days of levaquin so I don't believe he needs further antibiotics. Will give him pulmicort, prednisone, albuterol prn and scheduled Duo-nebs. I am requesting the PFTs from the [place]. I would consider a high resolution CT if he does not improve to ensure no other underlying disease is going on. He is exposed to a lot of birds, smokes cigarettes and marijuana so he  has several issues for dyspnea. No history of PE/clots but will check ddimer  2. Type 2 diabetes: will hold metformin. Will use sliding scale insulin for now  3. Hypertension: usual meds.  4. Tobacco abuse: I counseled him to quit but he does not want to hear it.     DVT prophylaxis--heparin    Code Status--Full Code  Patient meets inpatient criteria with a LOS estimation to be greater than 2 midnighs. Patient meets intensity of service and medical necessity in that he is hypoxic. If patient were to be not to admitted potential adverse events include respiratory failure.  Total time spent 55 minutes    Electronically signed [id number] Britta Mccreedy [id number] PM  This dictation was produced using voice recognition software. Despite concurrent proofreading, please note transcription errors may be present and that these errors may not truly reflect my intent.   cc:CC Providers:    PCP  Gerrit Heck, MD  Attending  Elise Benne, MD  Admitting  Letta Kocher Kenedi Cilia, DO  Author  Letta Kocher Kennidee Heyne

## 2015-09-23 NOTE — ED Notes (Signed)
Pt resting on gur, no complaints. Seems to be breathing easier after treatments. Per pt he feels the same, states he could tell a difference if he got up and walked but doesn't feel like getting up right now. Call light and SO at bedside, pt in good spirits, pwd awake alert/nad

## 2015-09-23 NOTE — ED Provider Notes (Signed)
[facility] EMERGENCY DEPARTMENT ENCOUNTER    History and Physical     Name: Charles [name]  DOB: 11-08-1945 70 y.o.  MRN: [id number]  CSN: [id number]    HISTORY:     CHIEF COMPLAINT    Shortness of breath despite Levaquin and Prednisone     HPI    Charles [name] is a 70 y.o. male with a history of bronchitis, who presents to the ED for shortness of breath that has not improved since being placed on Levaquin and a 6 day course of Prednisone to treat pneumonia that was diagnosed by Pamella Pert, ANP, at his PCP's office a week ago. The patient reports that at that time, he had a cough with minimal sputum, rhinorrhea, shortness of breath, diffuse myalgias, 2 episodes of emesis, and diarrhea, but no fever. He has not had any episodes of vomiting since "Sunday, and his last loose stool was 2 days ago. The diarrhea proceeded his antibiotic use. The patient does not have a history of DVT or PE. No active cancer, recent surgery, lower extremity edema or hemoptysis. No history of asthma or COPD.   Upon review of the chest X-Ray on 12/28, there is no evidence of pneumonia.     PAST MEDICAL HISTORY    Past Medical History   Diagnosis Date   . Angina at rest    . Arrhythmia      years ago, EPISODES of vtach whole life   . COPD (chronic obstructive pulmonary disease)    . Coronary artery disease    . Diabetes mellitus 2013   . GERD (gastroesophageal reflux disease)      for years   . Hyperlipidemia    . Hypertension 2006   . NSTEMI (non-ST elevated myocardial infarction) 08/2006 with stent mid left circumflex   . Stroke 1980's     had neuro changes but resolved       SURGICAL HISTORY    Past Surgical History   Procedure Laterality Date   . Coronary stent placement Left mid circumflex     12" /2007   . Cardiac catheterization     . Eye surger       Tear duct surgery   . Tonsillectomy       as a child       CURRENT MEDICATIONS    Home Medications     Med List Status:  Complete Letta Kocher Hirni, DO 09/23/2015  9:56 PM           Last Dose     amLODIPine (NORVASC) 10 MG tablet 09/23/2015     Take 10 mg by mouth daily.     aspirin 81 MG EC tablet 09/23/2015     Take 81 mg by mouth daily.     benzonatate (TESSALON) 100 MG capsule      Take 100 mg by mouth 3 (three) times daily as needed for Cough.     isosorbide mononitrate (IMDUR) 30 MG 24 hr tablet 09/23/2015     Take 1 tablet (30 mg total) by mouth daily.     losartan (COZAAR) 100 MG tablet 09/23/2015     Take 1 tablet (100 mg total) by mouth daily.     metFORMIN (GLUCOPHAGE) 1000 MG tablet 09/23/2015     Take 1,000 mg by mouth 2 (two) times daily with meals.     metoprolol (LOPRESSOR) 50 MG tablet 09/23/2015     Take 1 tablet (50 mg total) by mouth 2 (two) times daily.  nitroglycerin (NITROSTAT) 0.4 MG SL tablet Unknown     Place 0.4 mg under the tongue every 5 (five) minutes as needed for Chest pain.     pantoprazole (PROTONIX) 40 MG tablet 09/23/2015     Take 40 mg by mouth daily.     rosuvastatin (CRESTOR) 20 MG tablet 09/23/2015     Take 20 mg by mouth nightly.           ALLERGIES    Gabapentin and Iodine and iodide containing products    FAMILY HISTORY    Family History   Problem Relation Age of Onset   . Heart attack Mother    . Heart disease Mother    . Stroke Mother    . Heart disease Father        SOCIAL HISTORY    Social History   Substance Use Topics   . Smoking status: Current Every Day Smoker     Packs/day: 1.00     Years: 52.00   . Smokeless tobacco: Former Neurosurgeon      Comment: Dr. Alfred Levins discussed smoking cessation with visit.   Marland Kitchen Alcohol use No     Smoking Cessation Counseling: The patient was counseled as to the multiple risks to his health from continued use of tobacco products. It was explained that continuing to smoke may lead to multiple short and long term negative health consequences, including but not limited to mouth/esophageal/lung cancer, COPD, and heart disease. He states he understands these risks, and also understands the options and resources available to him to help  him stop smoking. The total time spent counseling the patient regarding tobacco cessation was 4 minutes.     REVIEW OF SYSTEMS:     REVIEW OF SYSTEMS:  Constitutional:  Denies fever  Eyes:  Denies eye pain  HENT:  Positive rhinorrhea   Respiratory:  Positive cough productive of minimal sputum; no blood. Positive shortness of breath. No   Cardiovascular:  Denies chest pain, palpitations  GI:  Denies abdominal pain or nausea. Positive 2 episodes of vomiting; none since Sunday. Positive diarrhea, none today  Musculoskeletal:  Denies back pain.   Neurologic:  Denies headache, focal neurologic symptoms  Lymphatic:  Denies swollen glands   Psychiatric:  Denies anxiety and depression  All systems negative except as marked.       PHYSICAL EXAM:   VITAL SIGNS:    Initial Vitals   BP 09/23/15 1723 156/84   Pulse 09/23/15 1723 96   Resp 09/23/15 1723 16   Temp 09/23/15 1723 36.7 ?C (98.1 ?F)   SpO2 09/23/15 1723 89 %   Vital Signs: Reviewed the patient's vital signs.  Pulse oximetry interpretation: 89% on room air. Hypoxic.     Vital Signs: Reviewed the patient's vital signs.  Pulse oximetry interpretation: 93% on 2 liters/min via nasal cannula. Improved after treatment.      Constitutional:  No acute distress, Non-toxic appearance.   HENT:  Normocephalic, Atraumatic, Oropharynx moist  Eyes:   Sclera anicteric  Respiratory:  Diffuse wheeze, moving only fair air, but speaking in full sentences.   Cardiovascular:  Normal heart rate, Normal rhythm, No murmurs, No rubs, No gallops.   GI:  Bowel sounds normal, Soft, No tenderness  Musculoskeletal:  No lower extremity edema. No cyanosis, No clubbing.  Integument:  Warm, Dry, No erythema, No rash.   Neurologic:  Alert & oriented x 3, moves all extremities  Psychiatric:  Affect normal, Judgment normal, Mood normal.  DATA:     LABS    Results for orders placed or performed during the hospital encounter of 09/23/15 (from the past 24 hour(s))   Lactic Acid (ED) -STAT    Collection  Time: 09/23/15  7:23 PM   Result Value Ref Range    Lactic Acid ED 1.4 0.5 - 2.2 mmol/L   CBC with Auto Differential -STAT    Collection Time: 09/23/15  7:24 PM   Result Value Ref Range    WBC 10.7 4.8 - 10.8 10*3/?L    RBC 5.26 4.70 - 6.10 10*6/?L    Hemoglobin 15.4 12.0 - 18.0 g/dL    HCT 81.1 91.4 - 78.2 %    MCV 87.5 81.0 - 99.0 fL    MCH 29.3 27.0 - 34.0 pg    MCHC 33.5 32.0 - 36.0 g/dL    RDW 95.6 21.3 - 08.6 %    Platelet Count 183 150 - 400 10*3/?L    MPV 8.7 7.4 - 10.4 fL    Neutrophils % 45 35 - 70 %    Lymphocytes % 46 (H) 25 - 45 %    Monocytes % 8 0 - 12 %    Eosinophils % 0 0 - 7 %    Basophils % 1 0 - 2 %    Neutrophils, Absolute 4.8 1.6 - 7.3 10*3/?L    Lymphocytes, Absolute 4.9 (H) 1.1 - 4.3 10*3/?L    Monocytes, Absolute 0.8 0.0 - 1.2 10*3/?L    Eosinophils, Absolute 0.0 0.0 - 0.7 10*3/?L    Basophils, Absolute 0.1 0.0 - 0.2 10*3/?L    Differential Type Automated Differential    Comprehensive Metabolic Panel -STAT    Collection Time: 09/23/15  7:24 PM   Result Value Ref Range    Sodium 135 135 - 143 mmol/L    Potassium 3.9 3.5 - 5.1 mmol/L    Chloride 102 98 - 111 mmol/L    CO2 - Carbon Dioxide 23.1 21.0 - 31.0 mmol/L    Glucose 186 (H) 80 - 99 mg/dL    BUN 17 6 - 23 mg/dL    Creatinine 5.78 4.69 - 1.30 mg/dL    Calcium 9.6 8.6 - 62.9 mg/dL    AST - Aspartate Aminotransferase 27 10 - 50 IU/L    ALT - Alanine Amino transferase 50 7 - 52 IU/L    Alkaline Phosphatase 55 34 - 104 IU/L    Bilirubin Total 0.4 0.3 - 1.2 mg/dL    Protein Total 6.5 6.0 - 8.0 g/dL    Albumin 4.2 3.5 - 5.0 g/dL    Globulin 2.3 2.2 - 3.7 g/dL    Albumin/Globulin Ratio 1.8 >0.9    Anion Gap 9.9 3.0 - 11.0 mmol/L    GFR Estimate >60 >=60 mL/min/1.23m*2    GFR Additional Info     Blood Culture -x 2    Collection Time: 09/23/15  7:24 PM   Result Value Ref Range    Blood Culture Result No Growth to Date    D Dimer, Quantitative -STAT    Collection Time: 09/23/15  7:24 PM   Result Value Ref Range    D Dimer, Quantitative 507 (HCrit)  100 - 500 ng/mL FEU   Blood Culture -x 2    Collection Time: 09/23/15  7:27 PM   Result Value Ref Range    Blood Culture Result No Growth to Date    Rapid Influenza A & B by NAAT(Stat) -STAT    Collection Time: 09/23/15  9:01 PM   Result Value Ref Range    Influenza A Influenza A Viral RNA Not Detected Influenza A Viral RNA Not Detected    Influenza B Influenza B Viral RNA Not Detected Influenza B Viral RNA Not Detected       RADIOLOGY/PROCEDURES    X-ray chest PA and lateral   Final Result   IMPRESSION:     1.  Hyperexpanded clear lungs. Hyperexpansion may relate to chronic obstructive    pulmonary disease if patient has a history of smoking. No superimposed acute    pneumonia identified.              PLAN:     ED COURSE & MEDICAL DECISION MAKING    Pertinent Labs & Imaging studies reviewed. (See chart for details)     Ddx:  1. COPD   2. CHF   3. Asthma   4. Pneumonia  5. Bronchitis  6. Pneumothorax  7. Pulmonary Embolism  8. Acute Coronary Syndrome  9. Influenza     Plan: Check laboratory evidence, Chest X-Ray, Influenza culture, Nebulizer breathing treatments, Treat with Solumedrol, Reassess, Plan on admission    This patient presents with a viral illness, to negative chest x-rays in the last week and continued shortness of breath and hypoxemia. While he does not have a formal history of COPD his chest x-ray suggests she has COPD and he continues to smoke. A shunt reported runny nose muscle aches in addition to his cough productive of yellow phlegm. He has no PE risk factors and his d-dimer is age appropriate. He is not having any chest pain or evidence of acute coronary syndrome. His flu studies are negative but he has an influenza-like condition. There is no evidence of congestive heart failure or other cause of the hypoxemia and shortness of breath. I doubt an exotic cause of continued wheezing and shortness of breath despite the fact that he is around birds.    FINAL IMPRESSION    1. Acute upper respiratory  infection with viral illness  2. COPD exacerbation        MD Scribe Attestation:  All medical record entries made by the Scribe were at my direction and personally dictated by me, Dr. Elise Benne, MD. I have reviewed the chart and agree that the record accurately reflects my personal performance of the history, physical exam, assessment, and plan. I have also personally directed, reviewed, and agree with the discharge instructions.    Scribe Attestation:  Documented by [employer], acting as a scribe for Dr. Elise Benne, MD.     This document has been created using voice recognition software.  Please be aware that the software may not accurately reflect the intent of dictating physician when reading this note.        Elise Benne, MD  09/23/15 [phone]

## 2015-09-23 NOTE — Telephone Encounter (Signed)
Pt is needing Triaged. Pt has been sick and does not feel better has pneumonia.

## 2015-09-23 NOTE — ED Notes (Signed)
O2 2LPM via NC brisk return to 92%.

## 2015-09-23 NOTE — Telephone Encounter (Signed)
Was alerted to patient's situation by [facility]. Spoke with patient. He states he is feeling worse and feels that he cannot catch his breath. He is speaking in 3 word sentences and is audibly having trouble breathing. Advised patient to go to ED as he has pneumonia and worsening SOB. He states he has a ride and will not drive himself. Patient verbalized understanding, closing encounter.

## 2015-09-23 NOTE — ED Triage Notes (Signed)
Patient saw his MD last Wednesday and diagnosed with pneumonia and started on Levaquin. Patient has not improved and is SOB.

## 2015-09-23 NOTE — ED Provider Notes (Signed)
I saw this patient in my role as the ED provider at triage.  As such I did limited history and physical exam to confirm the patient's triage level and order appropriate studies   (if any) to begin the patient's workup. The patient understands that further evaluation will be done when they are brought back to an ED room.      Chief complaint: Worsening SOB with recent Dx and outpt Tx with Levaquin and prednisone for Pneumonia. Presents with 88-89% SaO2 on RA. Dry cough reported.     Pertinent findings: BS decreased in bases. Placed on O2 at 2 L/M    Plan: Chest xray, CBC, CMP. BC x 2. Lactic.      Lorre Nick, [place]  09/23/15 1734

## 2015-09-24 ENCOUNTER — Encounter: Payer: MEDICARE | Attending: Adult Health | Primary: Family

## 2015-09-24 ENCOUNTER — Inpatient Hospital Stay
Admission: EM | Admit: 2015-09-24 | Discharge: 2015-09-26 | Disposition: A | Discharge status: Home / Self Care | Admitting: MD

## 2015-09-24 ENCOUNTER — Inpatient Hospital Stay: Admit: 2015-09-24 | Primary: Family

## 2015-09-24 LAB — CBC WITH AUTO DIFFERENTIAL
Basophils %: 1 % (ref 0–2)
Basophils %: 0 % (ref 0–2)
Basophils, Absolute: 0.1 10*3/??L (ref 0.0–0.2)
Basophils, Absolute: 0 10*3/ÂµL (ref 0.0–0.2)
Eosinophils %: 0 % (ref 0–7)
Eosinophils %: 0 % (ref 0–7)
Eosinophils, Absolute: 0 10*3/??L (ref 0.0–0.7)
Eosinophils, Absolute: 0 10*3/ÂµL (ref 0.0–0.7)
HCT: 46.1 % (ref 42.0–54.0)
HCT: 41.8 % — ABNORMAL LOW (ref 42.0–54.0)
Hemoglobin: 15.4 g/dL (ref 12.0–18.0)
Hemoglobin: 14.5 g/dL (ref 12.0–18.0)
Lymphocytes %: 46 % — ABNORMAL HIGH (ref 25–45)
Lymphocytes %: 32 % (ref 25–45)
Lymphocytes, Absolute: 4.9 10*3/??L — ABNORMAL HIGH (ref 1.1–4.3)
Lymphocytes, Absolute: 2 10*3/ÂµL (ref 1.1–4.3)
MCH: 29.3 pg (ref 27.0–34.0)
MCH: 30.2 pg (ref 27.0–34.0)
MCHC: 33.5 g/dL (ref 32.0–36.0)
MCHC: 34.6 g/dL (ref 32.0–36.0)
MCV: 87.5 fL (ref 81.0–99.0)
MCV: 87.3 fL (ref 81.0–99.0)
MPV: 8.7 fL (ref 7.4–10.4)
MPV: 8.8 fL (ref 7.4–10.4)
Monocytes %: 8 % (ref 0–12)
Monocytes %: 2 % (ref 0–12)
Monocytes, Absolute: 0.8 10*3/??L (ref 0.0–1.2)
Monocytes, Absolute: 0.2 10*3/ÂµL (ref 0.0–1.2)
Neutrophils %: 45 % (ref 35–70)
Neutrophils %: 66 % (ref 35–70)
Neutrophils, Absolute: 4.8 10*3/??L (ref 1.6–7.3)
Neutrophils, Absolute: 4.1 10*3/ÂµL (ref 1.6–7.3)
Platelet Count: 183 10*3/??L (ref 150–400)
Platelet Count: 164 10*3/??L (ref 150–400)
RBC: 5.26 10*6/??L (ref 4.70–6.10)
RBC: 4.79 10*6/ÂµL (ref 4.70–6.10)
RDW: 13.3 % (ref 11.5–14.5)
RDW: 13.1 % (ref 11.5–14.5)
WBC: 10.7 10*3/??L (ref 4.8–10.8)
WBC: 6.3 10*3/ÂµL (ref 4.8–10.8)

## 2015-09-24 LAB — COMPREHENSIVE METABOLIC PANEL
ALT - Alanine Aminotransferase: 50 IU/L (ref 7–52)
AST - Aspartate Aminotransferase: 27 IU/L (ref 10–50)
Albumin/Globulin Ratio: 1.8 (ref >0.9)
Albumin: 4.2 g/dL (ref 3.5–5.0)
Alkaline Phosphatase: 55 IU/L (ref 34–104)
Anion Gap: 9.9 mmol/L (ref 3.0–11.0)
BUN: 17 mg/dL (ref 6–23)
Bilirubin Total: 0.4 mg/dL (ref 0.3–1.2)
CO2 - Carbon Dioxide: 23.1 mmol/L (ref 21.0–31.0)
Calcium: 9.6 mg/dL (ref 8.6–10.3)
Chloride: 102 mmol/L (ref 98–111)
Creatinine: 1.01 mg/dL (ref 0.65–1.30)
GFR Estimate: >60 mL/min/{1.73_m2} (ref >=60)
Globulin: 2.3 g/dL (ref 2.2–3.7)
Glucose: 186 mg/dL — ABNORMAL HIGH (ref 80–99)
Potassium: 3.9 mmol/L (ref 3.5–5.1)
Protein Total: 6.5 g/dL (ref 6.0–8.0)
Sodium: 135 mmol/L (ref 135–143)

## 2015-09-24 LAB — BASIC METABOLIC PANEL
Anion Gap: 7.6 mmol/L (ref 3.0–11.0)
BUN: 13 mg/dL (ref 6–23)
CO2 - Carbon Dioxide: 22.4 mmol/L (ref 21.0–31.0)
Calcium: 8.4 mg/dL — ABNORMAL LOW (ref 8.6–10.3)
Chloride: 108 mmol/L (ref 98–111)
Creatinine: 0.71 mg/dL (ref 0.65–1.30)
GFR Estimate: >60 mL/min/{1.73_m2} (ref >=60)
Glucose: 169 mg/dL — ABNORMAL HIGH (ref 80–99)
Potassium: 4.2 mmol/L (ref 3.5–5.1)
Sodium: 138 mmol/L (ref 135–143)

## 2015-09-24 LAB — LACTIC ACID ED: Lactic Acid ED: 1.4 mmol/L (ref 0.5–2.2)

## 2015-09-24 LAB — D-DIMER, QUANTITATIVE: D Dimer, Quantitative: 507 ng/mL FEU (ref 100–500)

## 2015-09-24 LAB — INFLUENZA A & B
Influenza A: NOT DETECTED
Influenza B: NOT DETECTED

## 2015-09-24 LAB — POCT GLUCOSE
POC Glucose: 171 mg/dL — ABNORMAL HIGH (ref 80–99)
POC Glucose: 164 mg/dL — ABNORMAL HIGH (ref 80–99)

## 2015-09-24 LAB — BLOOD CULTURE
Blood Culture Result: NO GROWTH
Blood Culture Result: NO GROWTH

## 2015-09-24 MED ORDER — predniSONE (DELTASONE) tablet 40 mg
20 | Freq: Every day | ORAL | Status: DC
Start: 2015-09-24 — End: 2015-09-26
  Administered 2015-09-24 – 2015-09-26 (×3): 20 mg via ORAL

## 2015-09-24 MED ORDER — L. acidophilus, L. bulgaricus (BACID) tablet 1 tablet
1 | Freq: Two times a day (BID) | ORAL | Status: DC
Start: 2015-09-24 — End: 2015-09-26
  Administered 2015-09-24 – 2015-09-26 (×6): 1 via ORAL

## 2015-09-24 MED ORDER — heparin, porcine (PF) syringe 5,000 Units
5000 | Freq: Two times a day (BID) | INTRAMUSCULAR | Status: DC
Start: 2015-09-24 — End: 2015-09-26
  Administered 2015-09-24 (×2): via SUBCUTANEOUS

## 2015-09-24 MED ORDER — polyethylene glycol (MIRALAX) packet 17 g
17 | Freq: Every day | ORAL | Status: DC | PRN
Start: 2015-09-24 — End: 2015-09-26

## 2015-09-24 MED ORDER — ipratropium-albuterol (DUO-NEB) 0.5 mg-3 mg(2.5 mg base)/3 mL nebulizer solution 3 mL
0.5 | Freq: Four times a day (QID) | RESPIRATORY_TRACT | Status: DC
Start: 2015-09-24 — End: 2015-09-26
  Administered 2015-09-24 – 2015-09-26 (×9): 0.5 mL via RESPIRATORY_TRACT

## 2015-09-24 MED ORDER — acetaminophen (TYLENOL) tablet 650 mg
325 | ORAL | Status: DC | PRN
Start: 2015-09-24 — End: 2015-09-26

## 2015-09-24 MED ORDER — losartan (COZAAR) tablet 100 mg
50 | Freq: Every day | ORAL | Status: DC
Start: 2015-09-24 — End: 2015-09-26
  Administered 2015-09-24 – 2015-09-26 (×3): 50 mg via ORAL

## 2015-09-24 MED ORDER — metoprolol (LOPRESSOR) tablet 50 mg
50 | Freq: Two times a day (BID) | ORAL | Status: DC
Start: 2015-09-24 — End: 2015-09-26
  Administered 2015-09-24 – 2015-09-26 (×5): 50 mg via ORAL

## 2015-09-24 MED ORDER — isosorbide mononitrate (IMDUR) 24 hr tablet 30 mg
30 | Freq: Every day | ORAL | Status: DC
Start: 2015-09-24 — End: 2015-09-26
  Administered 2015-09-24 – 2015-09-26 (×3): 30 mg via ORAL

## 2015-09-24 MED ORDER — insulin aspart (NovoLOG) PEN
100 | Freq: Four times a day (QID) | SUBCUTANEOUS | Status: DC
Start: 2015-09-24 — End: 2015-09-26
  Administered 2015-09-24 – 2015-09-26 (×6): 100 unit/mL (3 mL) via SUBCUTANEOUS

## 2015-09-24 MED ORDER — ondansetron (ZOFRAN) injection 4 mg
4 | Freq: Four times a day (QID) | INTRAMUSCULAR | Status: DC | PRN
Start: 2015-09-24 — End: 2015-09-26

## 2015-09-24 MED ORDER — sodium chloride 0.9 % infusion
INTRAVENOUS | Status: DC
Start: 2015-09-24 — End: 2015-09-25
  Administered 2015-09-24 – 2015-09-25 (×4): via INTRAVENOUS

## 2015-09-24 MED ORDER — glucagon injection 1 mg
1 | INTRAMUSCULAR | Status: DC | PRN
Start: 2015-09-24 — End: 2015-09-26

## 2015-09-24 MED ORDER — benzocaine-menthol (CEPACOL) lozenge 1 lozenge
15-2.6 | Status: DC | PRN
Start: 2015-09-24 — End: 2015-09-26
  Administered 2015-09-25: 01:00:00 via ORAL

## 2015-09-24 MED ORDER — aspirin EC tablet 81 mg
81 | Freq: Every day | ORAL | Status: DC
Start: 2015-09-24 — End: 2015-09-26
  Administered 2015-09-24 – 2015-09-26 (×3): 81 mg via ORAL

## 2015-09-24 MED ORDER — budesonide (PULMICORT) nebulizer suspension 0.5 mg
0.5 | Freq: Two times a day (BID) | RESPIRATORY_TRACT | Status: DC
Start: 2015-09-24 — End: 2015-09-26
  Administered 2015-09-24 – 2015-09-26 (×5): 0.5 mg via RESPIRATORY_TRACT

## 2015-09-24 MED ORDER — guaifenesin-codeine (GUAIFENESIN AC) 10-100 mg/5 mL oral liquid 5 mL
10-100 | ORAL | Status: DC | PRN
Start: 2015-09-24 — End: 2015-09-26
  Administered 2015-09-24: 20:00:00 10-100 mL via ORAL

## 2015-09-24 MED ORDER — nitroglycerin (NITROSTAT) SL tablet 0.4 mg
0.4 | SUBLINGUAL | Status: DC | PRN
Start: 2015-09-24 — End: 2015-09-26

## 2015-09-24 MED ORDER — aluminum-magnesium hydroxide-simethicone (MAALOX PLUS) 200-200-20 mg/5 mL oral suspension 30 mL
200-200-20 | ORAL | Status: DC | PRN
Start: 2015-09-24 — End: 2015-09-26

## 2015-09-24 MED ORDER — dextrose 50 % syringe 12.5-25 g
INTRAVENOUS | Status: DC | PRN
Start: 2015-09-24 — End: 2015-09-26

## 2015-09-24 MED ORDER — nicotine (NICODERM CQ) 21 mg/24 hr patch 1 patch
21 | Freq: Every day | TRANSDERMAL | Status: DC
Start: 2015-09-24 — End: 2015-09-26
  Administered 2015-09-24: 17:00:00 21 via TRANSDERMAL

## 2015-09-24 MED ORDER — benzonatate (TESSALON) capsule 100 mg
100 | Freq: Three times a day (TID) | ORAL | Status: DC | PRN
Start: 2015-09-24 — End: 2015-09-26

## 2015-09-24 MED ORDER — omeprazole (PriLOSEC) capsule 20 mg
20 | Freq: Every morning | ORAL | Status: DC
Start: 2015-09-24 — End: 2015-09-26
  Administered 2015-09-24 – 2015-09-26 (×3): 20 mg via ORAL

## 2015-09-24 MED ORDER — amLODIPine (NORVASC) tablet 10 mg
5 | Freq: Every day | ORAL | Status: DC
Start: 2015-09-24 — End: 2015-09-26
  Administered 2015-09-24 – 2015-09-26 (×3): 5 mg via ORAL

## 2015-09-24 MED ORDER — nicotine polacrilex (NICORETTE) gum 2 mg
2 | BUCCAL | Status: DC | PRN
Start: 2015-09-24 — End: 2015-09-26

## 2015-09-24 MED ORDER — ipratropium-albuterol (DUO-NEB) 0.5 mg-3 mg(2.5 mg base)/3 mL nebulizer solution 3 mL
0.5 | Freq: Once | RESPIRATORY_TRACT | Status: AC
Start: 2015-09-24 — End: 2015-09-23
  Administered 2015-09-24: 05:00:00 0.5 mL via RESPIRATORY_TRACT

## 2015-09-24 MED ORDER — albuterol (PROVENTIL) nebulizer solution 2.5 mg
2.5 | Freq: Once | RESPIRATORY_TRACT | Status: AC
Start: 2015-09-24 — End: 2015-09-23
  Administered 2015-09-24: 05:00:00 2.5 mg via RESPIRATORY_TRACT

## 2015-09-24 MED ORDER — rosuvastatin (CRESTOR) tablet 20 mg
10 | Freq: Every evening | ORAL | Status: DC
Start: 2015-09-24 — End: 2015-09-26
  Administered 2015-09-25 – 2015-09-26 (×2): 10 mg via ORAL

## 2015-09-24 MED ORDER — polyvinyl alcohol (LIQUIFILM TEARS) 1.4 % ophthalmic solution 1-2 drop
1.4 | OPHTHALMIC | Status: DC | PRN
Start: 2015-09-24 — End: 2015-09-26

## 2015-09-24 MED ORDER — naloxone (NARCAN) injection 0.08 mg
0.4 | INTRAMUSCULAR | Status: DC | PRN
Start: 2015-09-24 — End: 2015-09-26

## 2015-09-24 MED ORDER — methylPREDNISolone sod suc (Solu-MEDROL) injection 125 mg
125 | Freq: Once | INTRAMUSCULAR | Status: AC
Start: 2015-09-24 — End: 2015-09-23
  Administered 2015-09-24: 05:00:00 125 mg via INTRAVENOUS

## 2015-09-24 MED FILL — HEPARIN, PORCINE (PF) 5,000 UNIT/0.5 ML SYRINGE: 5000 unit/0.5 mL | INTRAMUSCULAR | Qty: 0.5

## 2015-09-24 MED FILL — BACID 1 BILLION CELL-250 MG TABLET: 1 billion cell- 250 mg | ORAL | Qty: 1

## 2015-09-24 MED FILL — CODEINE 10 MG-GUAIFENESIN 100 MG/5 ML ORAL LIQUID: 10-100 | ORAL | Qty: 5

## 2015-09-24 MED FILL — BENZONATATE 100 MG CAPSULE: 100 mg | ORAL | Qty: 1

## 2015-09-24 MED FILL — ASPIRIN 81 MG TABLET,ENTERIC COATED: 81 mg | ORAL | Qty: 1

## 2015-09-24 MED FILL — LOSARTAN 50 MG TABLET: 50 mg | ORAL | Qty: 2

## 2015-09-24 MED FILL — PREDNISONE 20 MG TABLET: 20 mg | ORAL | Qty: 2

## 2015-09-24 MED FILL — NICOTINE 21 MG/24 HR DAILY TRANSDERMAL PATCH: 21 mg/24 hr | TRANSDERMAL | Qty: 1

## 2015-09-24 MED FILL — IPRATROPIUM 0.5 MG-ALBUTEROL 3 MG (2.5 MG BASE)/3 ML NEBULIZATION SOLN: 0.5 mg-3 mg(2.5 mg base)/3 mL | RESPIRATORY_TRACT | Qty: 1

## 2015-09-24 MED FILL — OMEPRAZOLE 20 MG CAPSULE,DELAYED RELEASE: 20 mg | ORAL | Qty: 1

## 2015-09-24 MED FILL — AMLODIPINE 5 MG TABLET: 5 mg | ORAL | Qty: 2

## 2015-09-24 MED FILL — METOPROLOL TARTRATE 50 MG TABLET: 50 mg | ORAL | Qty: 1

## 2015-09-24 MED FILL — ISOSORBIDE MONONITRATE ER 30 MG TABLET,EXTENDED RELEASE 24 HR: 30 mg | ORAL | Qty: 1

## 2015-09-24 MED FILL — CEPACOL SORE THROAT (BENZOCAINE-MENTHOL) 15 MG-2.6 MG LOZENGES: 15-2.6 mg | Qty: 1

## 2015-09-24 MED FILL — SOLU-MEDROL (PF) 125 MG/2 ML SOLUTION FOR INJECTION: 125 | INTRAMUSCULAR | Qty: 2

## 2015-09-24 MED FILL — NOVOLOG FLEXPEN U-100 INSULIN ASPART 100 UNIT/ML (3 ML) SUBCUTANEOUS: 100 unit/mL (3 mL) | SUBCUTANEOUS | Qty: 3

## 2015-09-24 NOTE — Progress Notes (Signed)
CHIEF COMPLAINT:  Shortness of breath.    SUBJECTIVE:  The patient is a 70 year old male with history of coronary artery   disease, type 2 diabetes mellitus, GERD, hyperlipidemia, hypertension, probable   COPD who came in with increasing shortness of breath, had recently been treated   for 7 days with Levaquin for pneumonia, but his breathing did not improve.  He   was wheezing.  He came to the Emergency Room and was found to have evidence for   COPD, was started on bronchodilators, steroids, but not on antibiotics.  His   breathing has started improving.  He is still on 3 liters of oxygen, but   otherwise already starting to feel better with his breathing.  He is coughing,   but has not seen his sputum.    PHYSICAL EXAMINATION:  VITAL SIGNS:  Blood pressure is 116/65, temp is 97.8, pulse is 91, respirations   16, O2 sats are 92% on 3 liters.  GENERAL:  The patient is a 70 year old male who is currently sitting up in bed.   He is awake, alert, oriented x3.  NECK:  No JVD.  CHEST:  Has bilateral expiratory wheezing, no crackles.  CARDIOVASCULAR:  S1, S2, regular rate and rhythm.  No murmurs.  ABDOMEN:  Soft, nontender, nondistended.  EXTREMITIES:  No edema in the lower extremities bilaterally.  NEUROLOGIC:  Motor strength appears to be 5/5.  SKIN:  No skin rash.    LABORATORY DATA:  White cell count of 6300 with H and H of 14.5 and 41.8 with a   platelet count of 164.  Sodium is 138, potassium is 4.2, chloride is 108,   bicarbonate is 22.4, BUN and creatinine 13 and 0.71 with a glucose of 171.    Calcium is 8.4.    IMAGING STUDIES:  High resolution CT of the chest shows minimal bibasilar   atelectasis.  No findings to suggest congestive heart failure, coronary artery   calcification present.    ASSESSMENT AND PLAN:  1.  Acute respiratory failure likely related to chronic obstructive pulmonary   disease exacerbation, is showing improvement.  The patient had completed 7 days   of Levaquin.  His high resolution CT  scan does not show evidence for any   pneumonia.  No other evidence of interstitial lung disease.  We will continue   him on Pulmicort, prednisone, DuoNebs.  He is showing improvement.  I do not   think that he has any evidence for clots, even though the D-dimer was slightly   up at 507, likely related to his underlying chronic obstructive pulmonary   disease exacerbation.  The patient does have exposure to birds and continues to   smoke tobacco and marijuana.  This is likely his risk factors for chronic   obstructive pulmonary disease exacerbation.  He needs close followup with the   [place].  He recently had pulmonary function test done there.  Report currently   available.  If he continues to show improvement, we will try to wean him down   off of his oxygen.  He does not normally use oxygen, but is currently on 3   liters of oxygen.  We will try to maintain his oxygen saturation greater than   92%.  2.  Type 2 diabetes mellitus, currently on sliding scale insulin.  Can resume   metformin when he is discharged.  3.  Hypertension.  Continue home medications.  Blood pressure seems to be   stable.  4.  Tobacco abuse.  The patient was counseled regarding quitting, but he is not   interested.    Total time spent in the care and evaluation of this patient took 22 minutes.    Darrin Nipper, MD    MA/rl  D:  09/24/2015  4:20 P   T:  09/24/2015  4:44 P   TID:  [id number]  RECEIPT:    [id number]

## 2015-09-24 NOTE — Progress Notes (Signed)
HOME MEDICATION LIST REVIEWED BY PHARMACIST -  NOT ANTICIPATED TO CAUSE HARM     ? Home medication list in Epic was prepared during this inpatient visit, by a team member other than a pharmacist, in a best attempt to document medications that the patient takes in the outpatient setting    ? The pharmacist has reviewed the list, without direct patient communication, and has not identified any concerns that are anticipated to cause harm in a typical patient     ? Inaccuracies may still exist, and the home medication list has not been clinically evaluated for appropriate indications; please use clinical judgment when reconciling the list       Signed by: Tida Saner G. Selden Noteboom, RPH

## 2015-09-24 NOTE — ED Notes (Signed)
Pt sleeping on gur, awakens easily. Pt aware of care plan. No complaints. pwd awake alert/nad

## 2015-09-24 NOTE — Discharge Planning (AHS/AVS) (Signed)
Nurse Navigator      Name:Charles [name] Date of Birth:07-02-1946   Address: [address]  [place] [address] Phone: [phone] (home)    Diagnosis: COPD with exacerbation [J44.1]    High Risk Patient: Referred by: Shaaron Adler Reason for Referral: COPD   KGM:WNUUVO Pray, MD    Last office Visit:  Follow up Appts Readily Available:   Yes Specialists:  none    Last office visit:  Follow up Appts Readily Available:        Follow Up:  Carola Frost, MD         Patients Preferred Pharmacy:   [facility]  [address] [place]  Phone: [phone] Fax: [phone]    [facility] [address] [place], [place] - [address]  [address]  [place] [address]  Phone: [phone] Fax: [phone]       Language Barriers:  none    Able to Afford Meds?:  Yes  Meds picked up after D/C?:      Assessment   Living Situation: Living with spouse  What is important to you?  What was the primary reason for admit?  What does patient feel is biggest barrier to meeting their health care needs: [facility]  Team Identified barriers to care: knowledge deficit, tobacco use, lack of specialized care  Behaviors: Substance Abuse-tobacco  Interventions  and Resources     Care Services in place:    Care Services Referred to:[facility]   Transportation: self   Practical Needs:     Education/narration:Pt identified using name and DOB. Pt lying in bed with supplemental oxygen in place. Pt is very interested in education, attentive and asking many questions. Pt states he had PFT's at the [facility] and has the results at home. He will ask his wife to bring them in. Pt has a history of many drenched exposures to Agent Orange and is 100% service related covered through the [place]. Pt is a current smoker and has smoked from the womb. Pt's mother smoked during pregnancy, both parents smoked in the house and closed car and he started smoking at age 2. Pt has a 54  pack year history. Pt was surprisingly open to smoking cessation education. States he has been smoking very little for about 2 weeks because of illness and so he feels like he has a jump on it. In fact, pt removed his nicotine patch because he was smoking so little that he feels that is a step up in nicotine use and he doesn't want to withdraw from it again.  Pt has tried Chantix in the past but had to stop d/t side effects. Pt is open to the idea of Wellbutrin but wants to think about it. Whatever the means, the patient is willing to try to quit so that is a step forward. To his knowledge, pt has never seen a pulmonologist but is interested in being referred. At baseline, pt can walk about 100 yards slowly till he must rest d/t SOB. He has a daily smokers cough that produces some sputum that he does not look at. Pt has a Combivent Respimadt at home from a previous URI but he does not use it on a regular basis. Pt has not received the 2016 flu vaccine but does want to receive it before discharge. Pt has received one pneumonia vaccine.      . Interventions:  .  COPD pathophysiology education done. Disease zones/Rapid action plan went over in detail.   . Stressed importance of taking antibiotics if prescribed exactly as prescribed and until they are gone.   . Smoking cessation/avoidance of secondary smoke reinforced. Avoid triggers such as strong smells and poor air quality.   . Avoid infection: good hand hygiene, good nutrition/fluid, rest when needed, avoid sick contacts, avoid large crowds during cold and flu season. Cough and deep breathing. Importance of receiving needed flu/pneumonia vaccines.   . Advised pt that for approx 30 days after discharge he is more vulnerable to other disease processes and falls. Need to take care and have a low threshold of PCP intervention.   . Pt will need MDI training before d/c. Will consult Marinus Maw, RT case manager.  . Stressed importance of exercise as able.  . Breathing  exercises taught (normal inhalation, long exhalation) and instructed to perform often throughout the day   . Early PCP intervention if ill was strongly reinforced.   . Requested a post discharge [facility] referral, order placed on discharge order set and In Basket message sent to [facility] schedulers.      Needed Durable Medical Equipment: none   Care Coordination: Marinus Maw, discharge planning, [facility], Dr. Reino Kent     Goals of Care   Level of Engagement:excellent  Long Term Goal:I want to quit smoking so I don't get any worse Target Date:3-6 months    Completed Date:   Short Term Goal:Safe transition to discharge environment with all needed medications, prescriptions, referrals and DME             Target Date:    Completed Date:   Nita Sells, RN          Date: 09/24/2015  Pulmonary Nurse Navigator  Time spent with patient :1 hour

## 2015-09-24 NOTE — Plan of Care (Signed)
End of Shift Report     Significant Events Patient admitted to 4T for SOB, COPD exacerbation.      Relevant   Assessment Findings Patient sating at 90s on 4L nc      Patient/Family  Questions/Concerns None voiced at this time     DC Barriers

## 2015-09-24 NOTE — Plan of Care (Signed)
Patient frustrated at a poor night's sleep but reports feeling a bit better.  His strong cough continues. Cough syrup and lozenges PRN. Respiratory treatments per RT.  Afebrile. Good appetite.

## 2015-09-24 NOTE — ED Notes (Signed)
Pt sleeping on gur.  VSS.nad

## 2015-09-24 NOTE — Progress Notes (Addendum)
RESPIRATORY ASSESSMENT    SUBJECTIVE: Pt states he is a smoker. Pt denies home o2, CPAP, or home respiratory meds. Pt states that he he does not know what COPD is and has never heard of it. Pts chart states hx of COPD.       OBJECTIVE:   Current Vital Signs:    Pre and post C/S: Diminished   HR: 70    RR: 18               Cough: non-productive   SpO2: 95% on 4L       CXR: Done    ASSESSMENT:  Pt has an increase in aeration and feels his breathing has lossened up.       PLAN: Pulmicort BID (MD ordered) and Duo-neb QID. Encourage DB and C. Titrate O2 as tolerated.       GOAL(S):  Medication:   Mobilize secretions  Decrease airway inflammation  Decrease work of breathing  Minimize or eliminate adventitious breath sounds  Maximize airflow    Pulmonary/Bronchial Hygiene:  Improve mucocilliary clearance  Prevent or correct atelectasis      Supplemental Oxygen:   Maintain SpO2 > 90%  %  Normalize and maintain an adequate SpO2/PaO2 appropriate for patient clinical situation    Per PDP # 102 and MD orders    E.S.  Emilio Aspen Amorie Rentz, RRT

## 2015-09-24 NOTE — ED Notes (Signed)
Pt sleeping on gur. VSS/nad

## 2015-09-25 LAB — POCT GLUCOSE
POC Glucose: 130 mg/dL — ABNORMAL HIGH (ref 80–99)
POC Glucose: 170 mg/dL — ABNORMAL HIGH (ref 80–99)
POC Glucose: 193 mg/dL — ABNORMAL HIGH (ref 80–99)
POC Glucose: 212 mg/dL — ABNORMAL HIGH (ref 80–99)
POC Glucose: 253 mg/dL — ABNORMAL HIGH (ref 80–99)

## 2015-09-25 MED FILL — PREDNISONE 20 MG TABLET: 20 mg | ORAL | Qty: 2

## 2015-09-25 MED FILL — ASPIRIN 81 MG TABLET,ENTERIC COATED: 81 mg | ORAL | Qty: 1

## 2015-09-25 MED FILL — OMEPRAZOLE 20 MG CAPSULE,DELAYED RELEASE: 20 mg | ORAL | Qty: 1

## 2015-09-25 MED FILL — BACID 1 BILLION CELL-250 MG TABLET: 1 billion cell- 250 mg | ORAL | Qty: 1

## 2015-09-25 MED FILL — HEPARIN, PORCINE (PF) 5,000 UNIT/0.5 ML SYRINGE: 5000 unit/0.5 mL | INTRAMUSCULAR | Qty: 0.5

## 2015-09-25 MED FILL — NICOTINE 21 MG/24 HR DAILY TRANSDERMAL PATCH: 21 mg/24 hr | TRANSDERMAL | Qty: 1

## 2015-09-25 MED FILL — LOSARTAN 50 MG TABLET: 50 mg | ORAL | Qty: 2

## 2015-09-25 MED FILL — ISOSORBIDE MONONITRATE ER 30 MG TABLET,EXTENDED RELEASE 24 HR: 30 mg | ORAL | Qty: 1

## 2015-09-25 MED FILL — AMLODIPINE 5 MG TABLET: 5 mg | ORAL | Qty: 2

## 2015-09-25 MED FILL — METOPROLOL TARTRATE 50 MG TABLET: 50 mg | ORAL | Qty: 1

## 2015-09-25 MED FILL — CRESTOR 10 MG TABLET: 10 mg | ORAL | Qty: 2

## 2015-09-25 NOTE — Consults (Addendum)
Nutrition Assessment     Charles [name] is a 70 y.o. male, DOB 10-16-1945 admitted with Acute Respiratory Failure 2/2 COPD exacerbation    Reason for assessment:  2 Day    Subjective:  No unplanned wt loss or poor oral intake prior to admission noted. Patient reports a good appetite and to be consuming all of what he typically would have. He denies any nutrition education materials at this time.    Anthropometric Measurements:   Ht: 177.8 cm (70)        Admit Wt: 84.82  IBW: 73   %IBW: 115  BMI: 26.98  Ht/wt status:normal              Nutrition-Focused Physical Findings:  Patient appears well-nourished and well-hydrated  Nutrition risk factors: DM, Acute Respiratory Failure  Risk for nutritional deficit:mild     Patient does not meet criteria for malnutrition at this time.     Food and Nutritional Intake:  Diet Orders:   Procedures   . Diet, Diabetic Diabetic, Consistent Carbohydrate Medium; Yes - patient to order food     PO Intake is adequate 75-100% of recorded meals    Allergies: Gabapentin and Iodine and iodide containing products     Client History:  Admission Diagnosis: COPD with exacerbation [J44.1]    Diagnosis:     SNOMED CT(R)   1. COPD with exacerbation  ACUTE EXACERBATION OF CHRONIC OBSTRUCTIVE AIRWAYS DISEASE     Past Medical History   Diagnosis Date   . Angina at rest    . Arrhythmia      years ago, EPISODES of vtach whole life   . COPD (chronic obstructive pulmonary disease)    . Coronary artery disease    . Diabetes mellitus 2013   . GERD (gastroesophageal reflux disease)      for years   . Hyperlipidemia    . Hypertension 2006   . NSTEMI (non-ST elevated myocardial infarction) 08/2006 with stent mid left circumflex   . Stroke 1980's     had neuro changes but resolved     Past Surgical History   Procedure Laterality Date   . Coronary stent placement Left mid circumflex     08/2006   . Cardiac catheterization     . Eye surger       Tear duct surgery   . Tonsillectomy       as a child       Usual  meds include: reviewed.  Current meds include: Insulin, methyl prednisone, prednisone, L. acdiophilus    Biochemical Data, Medical Tests and Procedures:  Lab Results   Component Value Date/Time    NA 138 09/24/2015 05:19 AM    NA 137 05/11/2015 01:40 PM    K 4.2 09/24/2015 05:19 AM    K 3.9 05/11/2015 01:40 PM    CL 108 09/24/2015 05:19 AM    CL 103 05/11/2015 01:40 PM    CO2 22.4 09/24/2015 05:19 AM    CO2 25.4 05/11/2015 01:40 PM    GLU 169 (H) 09/24/2015 05:19 AM    GLU 130 (H) 05/11/2015 01:40 PM    BUN 13 09/24/2015 05:19 AM    BUN 14 05/11/2015 01:40 PM    CREATININES 0.71 09/24/2015 05:19 AM    CREATININES 1.11 05/11/2015 01:40 PM   reviewed.    Nutrition Diagnosis (Problem, Etiology, Signs and Symptoms)  No nutrition diagnosis at this time.    Nutrition Intervention:   Continue with Diabetic diet order,  PO intake goal, >65%.    Monitoring and Evaluation:   Trend wt, PO intake, and labs per mild protocol.     Follow Up: 4-8 days      Bethena Roys, RD  09/25/2015  2:13 PM  [facility]

## 2015-09-25 NOTE — Progress Notes (Signed)
CHIEF COMPLAINT:  Shortness of breath.    SUBJECTIVE:  The patient is a 70 year old man with history of coronary artery   disease, diabetes type 2, GERD, hyperlipidemia, hypertension, COPD who came to   the hospital with worsening shortness of breath for the last few days, even   despite being treated with 7 days of Levaquin for pneumonia.  In the emergency   room was found to have COPD exacerbation, was treated with some steroids and   bronchodilators, but not improving fast enough from ED.  Therefore, he was   admitted.  The patient continues to be on high liter of oxygen supplementation.   Therefore, he will remain in the hospital as an inpatient.    24-HOUR UPDATE:  The patient is slowly turning around now.  This is his third   day of hospital visit, and his oxygen is being titrated down at this time.  He   is not having any productive phlegm.  The patient was seen by pulmonary   navigator, who is trying to get him an appointment with a COPD specialist after   discharge.  The patient otherwise is probably ready for discharge tomorrow.    PHYSICAL EXAMINATION:  VITAL SIGNS:  Stable.  Blood pressure 167/82, pulse 76, respiratory rate 17,   temperature 36.7.  Oxygen is currently 92% on 1 liter.  GENERAL APPEARANCE:  The patient is a slim-appearing older gentleman who is   alert and oriented, sitting up in bed.  Significant other at bedside.  CHEST AND LUNGS:  Diminished breath sounds but less wheezing or rhonchi today.  CARDIOVASCULAR:  Regular rate and rhythm, sinus.  No murmur, rubs or gallops.  ABDOMEN:  Soft.  Normal bowel sounds, scaphoid, nontender.  EXTREMITIES:  Warm.  No edema, cyanosis or clubbing.    LABORATORY DATA:  Reviewed.  No recent labs.  His blood sugars were reviewed.    His blood sugars are in the 200 range.    IMPRESSION AND PLAN:  1.  Acute respiratory failure secondary to chronic obstructive pulmonary disease  exacerbation.  His oxygen status is improving.  CT showed no evidence of      pneumonia; therefore, no further evidence for antibiotics or indication for it.   Continue with Pulmicort, prednisone, DuoNebs.  He is also status post 7 days of   antibiotics; therefore, no role for any continued antibiotics at this time.  2.  Elevated D-dimer.  This is most likely age appropriate, given his age of 61   and his only D-dimer was 11.  Age-adjusted numbers are normal for him.  3.  Diabetes type 2.  This is uncontrolled secondary to steroids.  His blood   sugars will be better controlled once he is off of steroids.  Continue with   correction NovoLog.  4.  Hypertension, stable.  Blood pressure is well controlled.  5.  Tobacco abuse disorder.  He was counseled to quit.  He is even saying that   he does not need nicotine patches anymore.    DISPOSITION:  Hopefully home tomorrow.    Time of service today:  23 minutes.    Penelope Fittro Angelique Blonder, MD    THA/lw  D:  09/25/2015  4:42 P   T:  09/25/2015  5:04 P   TID:  [id number]  RECEIPT:    [id number]

## 2015-09-25 NOTE — Progress Notes (Signed)
CBG was 212. Insulin held at this time because pt has already consumed more than 50% of dinner prior to cbg check. Instructed pt to notify staff to have cbg done prior to eating in the future. Pt agrees with plan.

## 2015-09-25 NOTE — Discharge Planning (AHS/AVS) (Signed)
Routed chart notes to Elizabeth C @ [facility].

## 2015-09-25 NOTE — Plan of Care (Signed)
End of Shift Report     Significant Events No significant events.     Relevant   Assessment Findings none      Patient/Family  Questions/Concerns none     DC Barriers Referral to pulmonologist.

## 2015-09-26 ENCOUNTER — Inpatient Hospital Stay: Admit: 2015-09-26 | Primary: Family

## 2015-09-26 LAB — CBC WITH DIFFERENTIAL
Bands %: 1 % (ref 0–10)
Bands, Absolute: 0.1 10*3/ÂµL (ref 0.0–1.2)
Basophils %: 0 % (ref 0–2)
Basophils, Absolute: 0 10*3/ÂµL (ref 0.0–0.2)
Eosinophils %: 0 % (ref 0–7)
Eosinophils, Absolute: 0 10*3/ÂµL (ref 0.0–0.7)
HCT: 42 % (ref 42.0–54.0)
Hemoglobin: 14 g/dL (ref 12.0–18.0)
Lymphocytes %: 20 % — ABNORMAL LOW (ref 25–45)
Lymphocytes, Absolute: 2 10*3/ÂµL (ref 1.1–4.3)
MCH: 29.3 pg (ref 27.0–34.0)
MCHC: 33.4 g/dL (ref 32.0–36.0)
MCV: 87.7 fL (ref 81.0–99.0)
MPV: 8.9 fL (ref 7.4–10.4)
Monocytes %: 7 % (ref 0–12)
Monocytes, Absolute: 0.7 10*3/??L (ref 0.0–1.2)
Neutrophils %: 72 % — ABNORMAL HIGH (ref 35–70)
Neutrophils, Absolute: 7.4 10*3/ÂµL — ABNORMAL HIGH (ref 1.6–7.3)
Platelet Count: 177 10*3/ÂµL (ref 150–400)
Platelet Estimate: NORMAL
RBC Morphology: NORMAL
RBC: 4.79 10*6/ÂµL (ref 4.70–6.10)
RDW: 13 % (ref 11.5–14.5)
WBC: 10.2 10*3/??L (ref 4.8–10.8)

## 2015-09-26 LAB — POCT GLUCOSE
POC Glucose: 207 mg/dL — ABNORMAL HIGH (ref 80–99)
POC Glucose: 121 mg/dL — ABNORMAL HIGH (ref 80–99)
POC Glucose: 150 mg/dL — ABNORMAL HIGH (ref 80–99)

## 2015-09-26 LAB — BASIC METABOLIC PANEL
Anion Gap: 7.5 mmol/L (ref 3.0–11.0)
BUN: 12 mg/dL (ref 6–23)
CO2 - Carbon Dioxide: 26.5 mmol/L (ref 21.0–31.0)
Calcium: 8.4 mg/dL — ABNORMAL LOW (ref 8.6–10.3)
Chloride: 107 mmol/L (ref 98–111)
Creatinine: 0.8 mg/dL (ref 0.65–1.30)
GFR Estimate: >60 mL/min/{1.73_m2} (ref >=60)
Glucose: 138 mg/dL — ABNORMAL HIGH (ref 80–99)
Potassium: 3.6 mmol/L (ref 3.5–5.1)
Sodium: 141 mmol/L (ref 135–143)

## 2015-09-26 MED ORDER — sodium chloride 0.9 % (NS) syringe 10 mL
INTRAMUSCULAR | Status: DC | PRN
Start: 2015-09-26 — End: 2015-09-26

## 2015-09-26 MED ORDER — flu vaccine qs 2016-17 36mos up (PF) syringe 1 Syringe
60 | INTRAMUSCULAR | Status: AC
Start: 2015-09-26 — End: 2015-09-26
  Administered 2015-09-26: 20:00:00 60 via INTRAMUSCULAR

## 2015-09-26 MED ORDER — nicotine polacrilex (NICORETTE) 2 mg gum
2 | ORAL_TABLET | BUCCAL | 0 refills | Status: AC | PRN
Start: 2015-09-26 — End: 2015-10-26

## 2015-09-26 MED ORDER — budesonide (PULMICORT) 0.5 mg/2 mL nebulizer suspension
0.5 | Freq: Two times a day (BID) | RESPIRATORY_TRACT | 5 refills | 30.00000 days | Status: DC
Start: 2015-09-26 — End: 2015-10-05

## 2015-09-26 MED ORDER — ipratropium-albuterol (DUO-NEB) 0.5 mg-3 mg(2.5 mg base)/3 mL nebulizer solution
0.5 | Freq: Four times a day (QID) | RESPIRATORY_TRACT | 0 refills | 7.50000 days | Status: DC
Start: 2015-09-26 — End: 2015-10-05

## 2015-09-26 MED ORDER — predniSONE (DELTASONE) 20 MG tablet
20 | ORAL_TABLET | Freq: Every day | ORAL | 0 refills | 11.50000 days | Status: AC
Start: 2015-09-26 — End: 2015-10-01

## 2015-09-26 MED FILL — CRESTOR 10 MG TABLET: 10 mg | ORAL | Qty: 2

## 2015-09-26 MED FILL — PREDNISONE 20 MG TABLET: 20 mg | ORAL | Qty: 2

## 2015-09-26 MED FILL — BACID 1 BILLION CELL-250 MG TABLET: 1 billion cell- 250 mg | ORAL | Qty: 1

## 2015-09-26 MED FILL — METOPROLOL TARTRATE 50 MG TABLET: 50 mg | ORAL | Qty: 1

## 2015-09-26 MED FILL — ASPIRIN 81 MG TABLET,ENTERIC COATED: 81 mg | ORAL | Qty: 1

## 2015-09-26 MED FILL — FLUZONE QUAD 2016-17(PF) 60 MCG(15 MCGX4)/0.5 ML INTRAMUSCULAR SYRINGE: 60 mcg (15 mcg x 4)/0.5 mL | INTRAMUSCULAR | Qty: 1

## 2015-09-26 MED FILL — HEPARIN, PORCINE (PF) 5,000 UNIT/0.5 ML SYRINGE: 5000 unit/0.5 mL | INTRAMUSCULAR | Qty: 0.5

## 2015-09-26 MED FILL — OMEPRAZOLE 20 MG CAPSULE,DELAYED RELEASE: 20 mg | ORAL | Qty: 1

## 2015-09-26 MED FILL — ISOSORBIDE MONONITRATE ER 30 MG TABLET,EXTENDED RELEASE 24 HR: 30 mg | ORAL | Qty: 1

## 2015-09-26 MED FILL — LOSARTAN 50 MG TABLET: 50 mg | ORAL | Qty: 2

## 2015-09-26 MED FILL — AMLODIPINE 5 MG TABLET: 5 mg | ORAL | Qty: 2

## 2015-09-26 MED FILL — NICOTINE 21 MG/24 HR DAILY TRANSDERMAL PATCH: 21 mg/24 hr | TRANSDERMAL | Qty: 1

## 2015-09-26 NOTE — Progress Notes (Signed)
End of Shift Report     Significant Events Pt had uneventful night     Relevant   Assessment Findings Pt remains on 1 lpm O2.      Patient/Family  Questions/Concerns Pt stated he has not smoked for the 2 weeks he has been here and does not need the nicotine patch any longer. He would like a prescription for nicorette gum for if he has a cigarette craving.     DC Barriers Possible home O2 needed.

## 2015-09-26 NOTE — Other (Signed)
IP PT EVALUATION / INITIAL TREATMENT (GENERAL)    Patient:  Charles [name] / [id number] First session times: Second session times:   DOB:  03/05/1946 Start Time: 0940 Start Time:     Date:  09/26/2015 Stop Time: 1025 Stop Time:       Treatment Dx: Surgery / # Days Post-op: (if applicable)       Primary Dx: COPD exacerbation  Treatment Dx: Impaired mobility    /         Precautions:   Specific precautions: Other (Droplet)     Pertinent Past Medical Hx:   COPD, Tobacco use, DM II, GERD.     SUBJECTIVE   Pt awake in bed, agrees to PT.   Pain level:   Current status: Pain does not limit pt's ability to participate in therapy  Pain at start of session: 0 - No Pain  Pain at end of session: 0 - No Pain  Pain at rest: 0 - No Pain  Pain with activity: 0 - No Pain   Hx of current problem:  Therapy Goal: Home asap   Social history:    Lives with: Significant other   Physical Demands: Moderate   Prior LOF:  Prior ambulatory status: Walked without assistive device  Level of Independence: Pt. was independent in home & community    Home environment:  Type of Home: House  Home Accessibility: Style: One level  Home Accessibility: Number of stairs to enter home: 1    Home equipment:  Mobility Equipment: None     OBJECTIVE   Pt seen for PT eval.  Pt educated on use of Pulse Oximeter to monitor SpO2.  Pt reports he will be getting one for home so he can monitor himself.   Mental status:   General Demeanor: Pleasant;Cooperative  LOC: Alert  Orientation: Oriented x 4  Follows Commands: Follows MULTISTEP commands  Follows MULTISTEP Commands: Consistently  Attention span: Appears intact  Memory: Appears intact in social/therapy situations   Medical appliances:   Medical Appliances: SPO2 monitor   Strength:  UE strength: WFL bilaterally  LE strength: WFL bilaterally   ROM:  ROM  Upper extremity ROM: WFL bilaterally  Lower extremity ROM: WFL bilaterally   Sensation:  Upper extremities: Grossly intact bilaterally  Lower extremities:  Grossly intact bilaterally   Other:   Vitals  Position: Standing  Activity: Post-Exercise  HR: 93  SPO2: 93% on RA  Observation  Position on arrival: In bed      TREATMENT  Bed Mobility/Transfers:    Bed mobility       Rolling / logroll (if indicated,      or method of choice)        Transition to sitting up Supine to Sit: Independent   To resting position      Transition to sitting down Independent     Transition to supine Independent     Scooting Independent   Transfers      Transition to standing From bed: Independent     Method       Gait/Stairs:    Mobility - Gait (Level)  Distance: 360'  Assist level: Independent  Assistive Device: No device  Surface: Carpet     Balance:   No LOB t/o tx.   Training/Education:  Trainee: Patient  Training provided: Use of pulse oximeter   Safety/Room Set-up:     Call button accessible: Yes  Phone accessible: Yes  Position on departure (bed): 2 bedrails up  ASSESSMENT   Pt appears safe for d/c to home at current functional level.  Pt is discharged from PT.   Patient/Caregiver goals reviewed and integrated with rehab treatment plan:     Activity Tolerance:    Activity Tolerance: Endurance does not limit participation in activity   Rehab Potential:   Rehab Potential: Good   Safety Awareness:    Precaution Awareness: Fully aware of precaution(s)  Deficit Awareness: Fully aware of deficits  Correction of Errors: Self-corrects  Safety Judgment: Good awareness of safety   Equipment needs:  None   Consult Recommendations:   None   Discharge Recommendations:   Primary recommendation : No skilled PT services expected at time of discharge.     PLAN   Home with SO assist at current functional level.  No further skilled PT needs.       Treatment Plan: Frequency:    Plan: Pt. seen for Initial Eval/Discharge.  No skilled PT services needed at this time.  Initial/Discharge - No further acute care needs requiring therapy      Billable Time:  Therapist:      45 minutes  Mackenzy Grumbine, PT          This note to serve as a Discharge Summary if Jasen Hartstein is discharged from the hospital or from therapy services.

## 2015-09-26 NOTE — Discharge Planning (AHS/AVS) (Signed)
PER DCPRN I faxed ADT face sheet and nebulizer script to [facility].    Pensions consultant, Case Management Specialist 09/26/2015 3:08 PM

## 2015-09-26 NOTE — Progress Notes (Signed)
IV removed, tip intact. Reviewed AVS with patient including medications, follow up appts, and s/sx of when to call MD/return to ED. Patient and wife verbalized understanding and agreement. Pt left with all belongings and is D/C home in stable condition. Left ambulatory.

## 2015-09-26 NOTE — Discharge Planning (AHS/AVS) (Signed)
Pt was dc with nebulizer meds and needs machine.  [facility] will deliver machine to pt's home today.  Called for his nebulizer meds - pulmicort is expensive.  Pt is willing to pick up 60 pulmicort at [facility] and pay cash.  [facility]  The other medications were not expensive and pt would fill and pay cash.  Unfortunately, he does not think he has medicare part B.  This would cover his machine and pulmicort.  [facility] is aware that pt is also willing to pay out of pocket for machine as well.  [facility].    Pt is aware that we could fill some meds here but he did not want to wait 2-3 hours.  His first choice was [facility].    No other needs identified at this time.

## 2015-09-26 NOTE — Discharge Summary (Signed)
CHIEF COMPLAINT ON DISCHARGE:  Shortness of breath, improved.  The patient was   seen for COPD exacerbation.    DISCHARGE DIAGNOSES:  1.  Acute respiratory failure, secondary to chronic obstructive pulmonary   disease exacerbation.  This is improved.  2.  Chronic obstructive pulmonary disease exacerbation.  The patient was treated  with a short course of prednisone and nebulizer treatment.  The patient   discharged with these medications.  3.  Elevated D-dimer.  This is age appropriate.  4.  Diabetes type 2.  The patient's blood sugars are much improved now that he   is coming down on his steroids.  5.  Hypertension, stable.  6.  Tobacco abuse disorder.  The patient was counseled to quit.  He is getting   nicotine gum on discharge.    DISCHARGE MEDICATIONS:  Please see the list.  Discharge Medication List as of 09/26/2015 12:58 PM      START taking these medications    Details   budesonide (PULMICORT) 0.5 mg/2 mL nebulizer suspension Take 2 mLs (0.5 mg total) by nebulization every 12 (twelve) hours for 30 days., Starting 09/26/2015, Until Mon 10/26/15, Print      ipratropium-albuterol (DUO-NEB) 0.5 mg-3 mg(2.5 mg base)/3 mL nebulizer solution Take 3 mLs by nebulization 4 (four) times daily for 30 days., Starting 09/26/2015, Until Mon 10/26/15, Print      nicotine polacrilex (NICORETTE) 2 mg gum Take 1 each (2 mg total) by mouth as needed for Smoking cessation for up to 30 days., Starting 09/26/2015, Until Mon 10/26/15, Print      predniSONE (DELTASONE) 20 MG tablet Take 2 tablets (40 mg total) by mouth daily for 5 days., Starting 09/26/2015, Until Anvi Mangal 10/01/15, Print         CONTINUE these medications which have NOT CHANGED    Details   benzonatate (TESSALON) 100 MG capsule Take 100 mg by mouth 3 (three) times daily as needed for Cough., Until Discontinued, Historical Med      amLODIPine (NORVASC) 10 MG tablet Take 10 mg by mouth daily., Until Discontinued, Historical Med      aspirin 81 MG EC tablet Take 81 mg by mouth daily.,  Until Discontinued, Historical Med      isosorbide mononitrate (IMDUR) 30 MG 24 hr tablet Take 1 tablet (30 mg total) by mouth daily., Starting 05/14/2015, Until Fri 05/13/16, Normal      losartan (COZAAR) 100 MG tablet Take 1 tablet (100 mg total) by mouth daily., Starting 05/14/2015, Until Discontinued, Normal      metFORMIN (GLUCOPHAGE) 1000 MG tablet Take 1,000 mg by mouth 2 (two) times daily with meals., Until Discontinued, Historical Med      metoprolol (LOPRESSOR) 50 MG tablet Take 1 tablet (50 mg total) by mouth 2 (two) times daily., Starting 05/14/2015, Until Fri 05/13/16, Normal      nitroglycerin (NITROSTAT) 0.4 MG SL tablet Place 0.4 mg under the tongue every 5 (five) minutes as needed for Chest pain., Until Discontinued, Historical Med      pantoprazole (PROTONIX) 40 MG tablet Take 40 mg by mouth daily., Until Discontinued, Historical Med      rosuvastatin (CRESTOR) 20 MG tablet Take 20 mg by mouth nightly. , Until Discontinued, Historical Med               LABS ON DISCHARGE:  White count 10.2, hemoglobin 14, hematocrit 42, platelet   177.  Sodium 141, potassium 3.6, BUN 12, creatinine 0.80.  His CBGs are much  better controlled today.  Chest x-ray shows no pneumonia today.    BRIEF HISTORY AND HOSPITAL COURSE:  The patient is a very pleasant 70 year old   gentleman who usually follows up with Dr. Gerrit Heck.  He has history of   coronary artery disease, type 2 diabetes, hyperlipidemia, and GERD. Came to the   hospital with shortness of breath for the last few days.  He was already treated  as an outpatient with 7 days of Levaquin, and he was found to have COPD   exacerbation.  He was admitted to the hospital, he was on oxygen   supplementation, he was also treated with nebulizer therapy and increased   steroids.  With that, he slowly turned around, and his oxygen status has   improved enough that he is weaned off to room air.  The patient has been seen by  COPD specialist/pulmonary navigator who will  refer him to outpatient COPD   clinic.  Otherwise, ambulating ad lib independently, not requiring any oxygen on  discharge.    PHYSICAL EXAMINATION ON DISCHARGE:  VITAL SIGNS:  His blood pressure 138/70, pulse 75, respiratory rate 16,   temperature 37, oxygen 92-94% on room air.  GENERAL APPEARANCE:  The patient is an older gentleman who is tall and spry   appearing.  CHEST AND LUNGS:  Clear to auscultation bilaterally today.  CARDIAC:  Regular rate and rhythm.  No murmurs, rubs.  ABDOMEN:  Soft.  Normal bowel sounds, scaphoid.    FOLLOW UP:  The patient will follow up with Dr. Miquel Dunn in 1 week.  The patient   will follow up with COPD Clinic in 3-5 days.    ACTIVITY:  As tolerated.    DIET:  As tolerated.    Time of discharge is 24 minutes.    Christana Angelica Angelique Blonder, MD    THA/rm  D:  09/26/2015  12:45 P   T:  09/26/2015  2:00 P   TID:  [id number]  RECEIPT:    [id number]  cc:  Gerrit Heck, MD

## 2015-09-26 NOTE — Discharge Planning (AHS/AVS) (Signed)
Reviewed chart and discussed possible dc needs with dr Ezekiel Ina.  Per PT - pt safe to dc home - up independently. Not home bound for home health.  Pulmonary nurse is following.    Pt will likely not need oxygen at dc.  RT is following.     [facility].  May need to provide medications at dc to get thru weekend.    Transportation??    Continue to follow.

## 2015-09-28 NOTE — Telephone Encounter (Signed)
Attempted to call patient about recent hospitalization. Patient did not answer, will attempt again. LVM with my contact #. Left message regarding fu with pulmonology and need for PCP fu. Patient is in need of additional follow up. Clinic contact person notified that pt needs PCP hospital fu.   Future Appointments  Date Time Provider Department Center   10/02/2015 11:30 AM Charles Gwenyth Bouillon, MD [facility]

## 2015-09-29 NOTE — Telephone Encounter (Signed)
Left message regarding making a TCM appointment for patient. Awaiting call back.

## 2015-09-29 NOTE — Telephone Encounter (Signed)
PATIENT INFORMATION   Hospital: [facility]  Disposition: Home or Self Care  High Risk for Readmission: Yes  Contact made on 09/29/2015 by Magda Bernheim, spoke with patient.     PATIENT ASSESSMENT   How is patient feeling since being discharged from the hospital?  Upset that nebulizer is being rented and paid for by Medicare, then after 13 months of rental he owns it but he could have purchased it on line for $60.00. Does have dry cough but better. Home oxygen sat 93-94%. Eating and drinking without problem. Bowels moving normally. Still a little weak.   Were discharge instructions gone over with you prior to discharge?    Yes  Does patient have the support needed at home?    Yes  If patient has family or caregiver, do they have any questions or concerns?  no  If home care services were ordered at the time of discharge, have they made contact with the patient?    None ordered.   Has patient's breathing returned to baseline?  Yes  Has the patient had to use rescue inhaler, if so how often?  Has not needed to use. Not on his medication list but does have a handheld inhaler.   MEDICATIONS   Does patient understand what medication he is supposed to be taking?  Yes  Has patient filled all medications prescribed at discharge?  Did get a 2 week supply from [facility] then plans to get the rest filled at the [place].   Any questions about medication and/or side effects?  yes - Should I use the Duoneb 4 times during the day as directed? Advised yes and to discuss at fu visit.   Medications were reviewed with patient at time of call, questions & concerns were addressed. Patient reminded to bring all medications to appointment.    FOLLOW UP   Does patient have followup appointment with PCP?   No. Patient is in need of additional follow up. Clinic contact person notified to contact patient for TCM appointment.   Is the followup with PCP within 3-5 days of discharge?    Unknown  Does patient have followup  with a specialist?  If so who and when?  Future Appointments  Date Time Provider Department Center   10/02/2015 11:30 AM Diya Gwenyth Bouillon, MD [facility]     Does patient need help with transportation to appointments?  no  Reviewed and discussed any diagnostic tests and/or procedures that were recommended at the time of hospital discharge.    COMPLETION OF CALL   Are there any further questions or concerns the patient has prior to completing the call?  no  Is there anything that could have better prepared you for caring for yourself at home?  no  Patient notified of the potential of getting a survey and encouraged to fill it out.

## 2015-09-29 NOTE — Telephone Encounter (Signed)
Scheduled patient TCM f/u for Friday, 10/02/15. Closing encounter.

## 2015-10-02 ENCOUNTER — Encounter: Admit: 2015-10-02 | Discharge: 2015-10-02 | Payer: TRICARE (CHAMPUS) | Attending: Family | Primary: Family

## 2015-10-02 ENCOUNTER — Ambulatory Visit
Admit: 2015-10-02 | Discharge: 2015-10-02 | Payer: TRICARE (CHAMPUS) | Attending: Critical Care Medicine | Primary: Family

## 2015-10-02 DIAGNOSIS — J441 Chronic obstructive pulmonary disease with (acute) exacerbation: Secondary | ICD-10-CM

## 2015-10-02 DIAGNOSIS — J449 Chronic obstructive pulmonary disease, unspecified: Secondary | ICD-10-CM

## 2015-10-02 MED ORDER — albuterol (PROVENTIL) 2.5 mg /3 mL (0.083 %) nebulizer solution
2.5 | Freq: Four times a day (QID) | RESPIRATORY_TRACT | 2 refills | 17.00000 days | Status: DC | PRN
Start: 2015-10-02 — End: 2015-10-05

## 2015-10-02 MED ORDER — albuterol (PROVENTIL HFA;VENTOLIN HFA) 90 mcg/actuation inhaler
90 | RESPIRATORY_TRACT | 2 refills | Status: DC | PRN
Start: 2015-10-02 — End: 2016-02-26

## 2015-10-02 MED ORDER — tiotropium-olodaterol (STIOLTO RESPIMAT) 2.5-2.5 mcg/actuation Mist
2.5-2.5 | Freq: Every day | RESPIRATORY_TRACT | 2 refills | Status: DC
Start: 2015-10-02 — End: 2015-10-02

## 2015-10-02 MED ORDER — predniSONE (DELTASONE) 20 MG tablet
20 | ORAL_TABLET | Freq: Every day | ORAL | 0 refills | 11.50000 days | Status: AC
Start: 2015-10-02 — End: 2015-10-09

## 2015-10-02 MED ORDER — tiotropium-olodaterol (STIOLTO RESPIMAT) 2.5-2.5 mcg/actuation Mist
2.5-2.5 | Freq: Every day | RESPIRATORY_TRACT | 0 refills | Status: DC
Start: 2015-10-02 — End: 2015-11-27

## 2015-10-02 MED ORDER — buPROPion (WELLBUTRIN SR) 150 MG 12 hr tablet
150 | ORAL_TABLET | ORAL | 0 refills | 90.00000 days | Status: DC
Start: 2015-10-02 — End: 2015-12-22

## 2015-10-02 NOTE — Telephone Encounter (Signed)
[facility] other [facility]ul[facility] and Inhalers, left message to please call us ASAP. DM only wants patient on albuterol HFA, albuterol neb, and Stiolto.

## 2015-10-02 NOTE — Progress Notes (Signed)
PATIENT:    Charles [name]  DATE:   10/02/2015   DATE OF BIRTH:   12/16/1945    Assessment and Plan:  -- COPD: spirometric GOLD [place] without frequent exacerbation,  He is not on home oxygen therapy.  Patient is compliant with treatment plan currently on duo neb and pulmicort , discontinue current therapy.  Start stiolto 2 puffs once daily.  Albuterol inhaler and nebulizer as needed, max 6 times daily.  DC all other inhalers and nebs.  plz note that ipratropium is contraindicated with Stiolto  Add 7 day course of prednisone for recent exacerbation of COPD with incomplete resolution.    patient demonstrated  a good technique using MDI.  vaccines up to date with CDC guidelines.  He is due for Previnar 13 vaccine will admin next visit.  Discussed the symptoms of COPD exacerbation patient knows to call when he has symptoms of COPD exacerbation.  Discussed referral to pulmonary rehabilitation.  counseled regarding smoking cessation.      -Tobacco abuse - continues to smoke, I spent more than 3 minutes on counseling regarding smoking cessation. Patient is motivated to quit. his does quit date.  Will start bupropion 150 mg daily for 3 days then 150 mg twice daily for 7-12 weeks      Lung cancer screening, he recently had a CT chest he would be a good candidate for low dose CT screening starting in 2018 if he agrees, will discuss with him closer to that date.    FU in 2 months with spiro.    DIAGNOSITIC STUDIES: (Personally reviewed)    CT        REFERRRING PROVIDER: Kateri Plummer, MD      HISTORY OF PRESENT ILLNESS:   This is a 70 y.o. male with a history of CAD, suspected COPD.   Patient complains of shortness of breath started a few weeks ago, intermittent, are improving, associated with cough, slightly relieved by using inhaler, exacerbated by exertion.   Patient complains of cough of few weeks duration, intermittent productive of  sputum not sure what Colour is associated with dyspnea, no relieving or exacerbating  factors.  He had some dyspnea before all of this started for a few years but not as bad as this most recent episode.  Smoker , smoked 1 pck daily for most of his life.   finished prednisone yesterday.  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  The patient denies exposure to medications that can cause lung disease, radiation or chemotherapy.  He worked as a Naval architect.  He does have 3 dogs and one cat.      Review of systems:  Constitutional: Positive for activity change, appetite change, fatigue no chills, weight gain, night sweats.   ENT:  No facial pain/pressure, rhinorrhea/bloody nasal discharge, nasal stuffiness, difficulty swallowing, odynophagia, or change in voice/hoarseness.  Eyes:  no discharge, no itching.  Pulmonary: SEE HPI, negative otherwise.  Cardiovascular:  No Orthopnea, Paroxysmal Nocturnal Dyspnea,   GI:  No abdominal pain, nausea, vomiting, hematochezia, melena, heartburn, or reflux.    Endocrine:  No flushing, symptomatic hypoglycemia, polyuria, or polydipsia.   GU:  No dysuria, hematuria, frequency, urgency.  Musculoskeletal:  No new arthritis, arthralgias, myalgias   Dermatologic: No new rashes, pruritis, discoloration   Allergy/ immunology: no food allergies, no immunocompromise.  Neuro:  No new headache, visual disturbance, new muscle weakness, imbalance, seizure, history of stroke.  Hematologic: No easy bruising, easy/excessive bleeding.  Psychiatry: no agitation, no behavioral changes.                   Past Medical History   Diagnosis Date   . Angina at rest (CMS/HCC)    . Arrhythmia      years ago, EPISODES of vtach whole life   . COPD (chronic obstructive pulmonary disease) (CMS/HCC)    . Coronary artery disease    . Diabetes mellitus (CMS/HCC) 2013   . Fatigue    . GERD (gastroesophageal reflux disease)      for years   . Hyperlipidemia    . Hypertension 2006   . Interstitial lung disease (CMS/HCC)      . Narcolepsy and cataplexy    . NSTEMI (non-ST elevated myocardial infarction) (CMS/HCC) 08/2006 with stent mid left circumflex   . Stroke (CMS/HCC) 1980's     had neuro changes but resolved       Past Surgical History   Procedure Laterality Date   . Coronary stent placement Left mid circumflex     08/2006   . Eye surger       Tear duct surgery   . Tonsillectomy       as a child   . Cardiac catheterization  2016       Social History     Social History Main Topics   . Smoking status: Current Every Day Smoker     Packs/day: 0.25     Years: 52.00   . Smokeless tobacco: Former Neurosurgeon      Comment: Dr. Alfred Levins discussed smoking cessation with visit.   Marland Kitchen Alcohol use No   . Drug use: No   . Sexual activity: Yes     Partners: Female     Social History   . Marital status: Significant Other     Spouse name: N/A   . Number of children: 3   . Years of education: N/A     Other Topics Concern   . Exercise Yes     a little       Patient Active Problem List    Diagnosis SNOMED CT(R) Date Noted   . COPD with exacerbation (CMS/HCC) ACUTE EXACERBATION OF CHRONIC OBSTRUCTIVE AIRWAYS DISEASE 09/23/2015   . Coronary artery disease involving native coronary artery of native heart without angina pectoris CORONARY ARTERIOSCLEROSIS IN NATIVE ARTERY 08/30/2015   . Dyslipidemia DYSLIPIDEMIA 08/30/2015   . Tobacco use disorder TOBACCO USER 08/30/2015   . BMI 26.0-26.9,adult BODY MASS INDEX 25-29 - OVERWEIGHT 08/27/2015   . Typical angina (CMS/HCC) TYPICAL ANGINA 05/14/2015   . Type 2 diabetes mellitus treated without insulin (CMS/HCC) TYPE 2 DIABETES MELLITUS 09/20/2011   . Benign essential hypertension BENIGN ESSENTIAL HYPERTENSION 09/19/2004       Current Outpatient Prescriptions on File Prior to Visit   Medication Sig Dispense Refill   . amLODIPine (NORVASC) 10 MG tablet Take 10 mg by mouth daily.     Marland Kitchen aspirin 81 MG EC tablet Take 81 mg by mouth daily.     . benzonatate (TESSALON) 100 MG capsule Take 100 mg by mouth 3 (three) times daily as  needed for Cough.     . budesonide (PULMICORT) 0.5 mg/2 mL nebulizer suspension Take 2 mLs (0.5 mg total) by nebulization every 12 (twelve) hours for 30 days. 60 mL 5   . ipratropium-albuterol (DUO-NEB) 0.5 mg-3 mg(2.5 mg base)/3 mL nebulizer solution Take 3 mLs by nebulization 4 (four) times daily  for 30 days. 120 each 0   . isosorbide mononitrate (IMDUR) 30 MG 24 hr tablet Take 1 tablet (30 mg total) by mouth daily. 30 tablet 11   . losartan (COZAAR) 100 MG tablet Take 1 tablet (100 mg total) by mouth daily. 30 tablet 11   . metFORMIN (GLUCOPHAGE) 1000 MG tablet Take 1,000 mg by mouth 2 (two) times daily with meals.     . metoprolol (LOPRESSOR) 50 MG tablet Take 1 tablet (50 mg total) by mouth 2 (two) times daily. 60 tablet 11   . nicotine polacrilex (NICORETTE) 2 mg gum Take 1 each (2 mg total) by mouth as needed for Smoking cessation for up to 30 days. 100 tablet 0   . nitroglycerin (NITROSTAT) 0.4 MG SL tablet Place 0.4 mg under the tongue every 5 (five) minutes as needed for Chest pain.     . pantoprazole (PROTONIX) 40 MG tablet Take 40 mg by mouth daily.     . rosuvastatin (CRESTOR) 20 MG tablet Take 20 mg by mouth nightly.      . [EXPIRED] predniSONE (DELTASONE) 20 MG tablet Take 2 tablets (40 mg total) by mouth daily for 5 days. 10 tablet 0     No current facility-administered medications on file prior to visit.        Allergies   Allergen Reactions   . Gabapentin Other (See Comments)     Yellowing of skin.   . Iodine And Iodide Containing Products Anaphylaxis     CONTRAST DYE used years ago for heart cath       Family History   Problem Relation Age of Onset   . Heart attack Mother    . Heart disease Mother    . Stroke Mother    . Heart failure Mother    . Heart disease Father    . Heart failure Father        Social History   Substance Use Topics   . Smoking status: Current Every Day Smoker     Packs/day: 0.25     Years: 52.00   . Smokeless tobacco: Former Neurosurgeon      Comment: Dr. Alfred Levins discussed smoking  cessation with visit.   Marland Kitchen Alcohol use No           PHYSICAL EXAMINATION:    Visit Vitals   . BP 108/62 (BP Location: Right arm, Patient Position: Sitting)   . Pulse 70   . Temp 37.2 ?C (98.9 ?F) (Oral)   . Ht 5' 10 (1.778 m)   . Wt 186 lb (84.4 kg)   . SpO2 95%  Comment: RA   . BMI 26.69 kg/m2     Body mass index is 26.69 kg/(m^2).      GENERAL APPEARANCE:  Comfortable, in no acute distress.  HEAD: Normocephalic.  EYES:  PERRLA, no pallor, mot jaundiced.  NECK:  Supple, no LAP  RESPIRATORY:  Good air entry bilateral, expiratory wheezing bilaterally.  CARDIOVASCULAR: NL S1 and S2, no M/R/G.  MUSCULOSKELETAL: no joint swelling, no redness.  SKIN:  No skin rashes, or lesions.  NEUROLOGICAL:  no focal weakness, no cerebellar signs.  PSYCHIATRIC:  Normal affect, normal thought process, no signs of depression.  HEMATOLOGIC/LYMPHATIC/IMMUNOLOGIC: No LAP , no ecchymosis          Elyn Peers, MD    CC: Kateri Plummer, MD, Gerrit Heck, MD      This note was transcribed using speech recognition software. Please contact us for clarification if any questions  arise relating to the wording of this document.

## 2015-10-02 NOTE — Progress Notes (Signed)
Charles [name] is a 70 y.o. male.   Chief Complaint   Patient presents with   . Transition Care Management (Tcm)     [facility] 1/4 to 1/7.     Hospital Follow-Up: Charles [name] is a 70 y.o. male who is here today after being discharged from [facility]. He was discharged 5 days ago. The main problem requiring admission was COPD exacerbation. The Discharge summary and/or Transition of Care Management document was reviewed. Medication reconciliation has been preformed as indicated below. The day 2 post Discharge Transitional Care Management encounter was reviewed.   HISTORY OF PRESENT ILLNESS   HPI  Charles [name] is here today as a TCM visit. He was recently hospitalized for COPD exacerbation was secondary to pneumonia. He has much improved since his hospital admission. He is feeling well today he has a nonproductive cough. He does not feel significantly short of breath has no DOE has not had any coughing fits. He had an appointment with his pulmonologist today who started him on prednisone, albuterol, stiolto. He does not have any questions and feels well. He has tapered his cigarette use down but has not quit    No problems updated.  Past Medical History   Diagnosis Date   . Angina at rest (CMS/HCC)    . Arrhythmia      years ago, EPISODES of vtach whole life   . COPD (chronic obstructive pulmonary disease) (CMS/HCC)    . Coronary artery disease    . Diabetes mellitus (CMS/HCC) 2013   . Fatigue    . GERD (gastroesophageal reflux disease)      for years   . Hyperlipidemia    . Hypertension 2006   . Interstitial lung disease (CMS/HCC)    . Narcolepsy and cataplexy    . NSTEMI (non-ST elevated myocardial infarction) (CMS/HCC) 08/2006 with stent mid left circumflex   . Stroke (CMS/HCC) 1980's     had neuro changes but resolved     Past Surgical History   Procedure Laterality Date   . Coronary stent placement Left mid circumflex     08/2006   . Eye surger       Tear duct surgery   .  Tonsillectomy       as a child   . Cardiac catheterization  2016     Allergies   Allergen Reactions   . Gabapentin Other (See Comments)     Yellowing of skin.   . Iodine And Iodide Containing Products Anaphylaxis     CONTRAST DYE used years ago for heart cath     Social History     Social History Main Topics   . Smoking status: Current Every Day Smoker     Packs/day: 0.25     Years: 52.00   . Smokeless tobacco: Former Neurosurgeon      Comment: Dr. Alfred Levins discussed smoking cessation with visit.   Marland Kitchen Alcohol use No   . Drug use: No   . Sexual activity: Yes     Partners: Female     Social History   . Marital status: Significant Other     Spouse name: N/A   . Number of children: 3   . Years of education: N/A     Other Topics Concern   . Exercise Yes     a little     Family History   Problem Relation Age of Onset   . Heart attack Mother    . Heart disease  Mother    . Stroke Mother    . Heart failure Mother    . Heart disease Father    . Heart failure Father      Admission on 09/23/2015, Discharged on 09/26/2015   Component Date Value Ref Range Status   . WBC 09/23/2015 10.7  4.8 - 10.8 10*3/?L Final   . RBC 09/23/2015 5.26  4.70 - 6.10 10*6/?L Final   . Hemoglobin 09/23/2015 15.4  12.0 - 18.0 g/dL Final   . HCT 16/06/9603 46.1  42.0 - 54.0 % Final   . MCV 09/23/2015 87.5  81.0 - 99.0 fL Final   . MCH 09/23/2015 29.3  27.0 - 34.0 pg Final   . MCHC 09/23/2015 33.5  32.0 - 36.0 g/dL Final   . RDW 54/05/8118 13.3  11.5 - 14.5 % Final   . Platelet Count 09/23/2015 183  150 - 400 10*3/?L Final   . MPV 09/23/2015 8.7  7.4 - 10.4 fL Final   . Neutrophils % 09/23/2015 45  35 - 70 % Final   . Lymphocytes % 09/23/2015 46* 25 - 45 % Final   . Monocytes % 09/23/2015 8  0 - 12 % Final   . Eosinophils % 09/23/2015 0  0 - 7 % Final   . Basophils % 09/23/2015 1  0 - 2 % Final   . Neutrophils, Absolute 09/23/2015 4.8  1.6 - 7.3 10*3/?L Final   . Lymphocytes, Absolute 09/23/2015 4.9* 1.1 - 4.3 10*3/?L Final   . Monocytes, Absolute 09/23/2015 0.8   0.0 - 1.2 10*3/?L Final   . Eosinophils, Absolute 09/23/2015 0.0  0.0 - 0.7 10*3/?L Final   . Basophils, Absolute 09/23/2015 0.1  0.0 - 0.2 10*3/?L Final   . Differential Type 09/23/2015 Automated Differential   Final   . Sodium 09/23/2015 135  135 - 143 mmol/L Final   . Potassium 09/23/2015 3.9  3.5 - 5.1 mmol/L Final   . Chloride 09/23/2015 102  98 - 111 mmol/L Final   . CO2 - Carbon Dioxide 09/23/2015 23.1  21.0 - 31.0 mmol/L Final   . Glucose 09/23/2015 186* 80 - 99 mg/dL Final   . BUN 14/78/2956 17  6 - 23 mg/dL Final   . Creatinine 21/30/8657 1.01  0.65 - 1.30 mg/dL Final   . Calcium 84/69/6295 9.6  8.6 - 10.3 mg/dL Final   . AST - Aspartate Aminotransferase 09/23/2015 27  10 - 50 IU/L Final   . ALT - Alanine Amino transferase 09/23/2015 50  7 - 52 IU/L Final   . Alkaline Phosphatase 09/23/2015 55  34 - 104 IU/L Final   . Bilirubin Total 09/23/2015 0.4  0.3 - 1.2 mg/dL Final   . Protein Total 09/23/2015 6.5  6.0 - 8.0 g/dL Final   . Albumin 28/41/3244 4.2  3.5 - 5.0 g/dL Final   . Globulin 09/21/7251 2.3  2.2 - 3.7 g/dL Final   . Albumin/Globulin Ratio 09/23/2015 1.8  >0.9 Final   . Anion Gap 09/23/2015 9.9  3.0 - 11.0 mmol/L Final   . GFR Estimate 09/23/2015 >60  >=60 mL/min/1.51m*2 Final   . GFR Additional Info 09/23/2015    Final   . Lactic Acid ED 09/23/2015 1.4  0.5 - 2.2 mmol/L Final   . Blood Culture Result 09/23/2015 No Growth   Final   . Blood Culture Result 09/23/2015 No Growth   Final   . Influenza A 09/23/2015 Influenza A Viral RNA Not Detected  Influenza A Viral RNA  Not Detected Final   . Influenza B 09/23/2015 Influenza B Viral RNA Not Detected  Influenza B Viral RNA Not Detected Final   . D Dimer, Quantitative 09/23/2015 507* 100 - 500 ng/mL FEU Final   . WBC 09/24/2015 6.3  4.8 - 10.8 10*3/?L Final   . RBC 09/24/2015 4.79  4.70 - 6.10 10*6/?L Final   . Hemoglobin 09/24/2015 14.5  12.0 - 18.0 g/dL Final   . HCT 16/06/9603 41.8* 42.0 - 54.0 % Final   . MCV 09/24/2015 87.3  81.0 - 99.0 fL Final      . MCH 09/24/2015 30.2  27.0 - 34.0 pg Final   . MCHC 09/24/2015 34.6  32.0 - 36.0 g/dL Final   . RDW 54/05/8118 13.1  11.5 - 14.5 % Final   . Platelet Count 09/24/2015 164  150 - 400 10*3/?L Final   . MPV 09/24/2015 8.8  7.4 - 10.4 fL Final   . Neutrophils % 09/24/2015 66  35 - 70 % Final   . Lymphocytes % 09/24/2015 32  25 - 45 % Final   . Monocytes % 09/24/2015 2  0 - 12 % Final   . Eosinophils % 09/24/2015 0  0 - 7 % Final   . Basophils % 09/24/2015 0  0 - 2 % Final   . Neutrophils, Absolute 09/24/2015 4.1  1.6 - 7.3 10*3/?L Final   . Lymphocytes, Absolute 09/24/2015 2.0  1.1 - 4.3 10*3/?L Final   . Monocytes, Absolute 09/24/2015 0.2  0.0 - 1.2 10*3/?L Final   . Eosinophils, Absolute 09/24/2015 0.0  0.0 - 0.7 10*3/?L Final   . Basophils, Absolute 09/24/2015 0.0  0.0 - 0.2 10*3/?L Final   . Differential Type 09/24/2015 Automated Differential   Final   . Sodium 09/24/2015 138  135 - 143 mmol/L Final   . Potassium 09/24/2015 4.2  3.5 - 5.1 mmol/L Final   . Chloride 09/24/2015 108  98 - 111 mmol/L Final   . CO2 - Carbon Dioxide 09/24/2015 22.4  21.0 - 31.0 mmol/L Final   . Glucose 09/24/2015 169* 80 - 99 mg/dL Final   . BUN 14/78/2956 13  6 - 23 mg/dL Final   . Creatinine 21/30/8657 0.71  0.65 - 1.30 mg/dL Final   . Calcium 84/69/6295 8.4* 8.6 - 10.3 mg/dL Final   . Anion Gap 28/41/3244 7.6  3.0 - 11.0 mmol/L Final   . GFR Estimate 09/24/2015 >60  >=60 mL/min/1.44m*2 Final   . GFR Additional Info 09/24/2015    Final   . POC Glucose 09/24/2015 171* 80 - 99 mg/dL Final   . POC Glucose 09/21/7251 164* 80 - 99 mg/dL Final   . POC Glucose 66/44/0347 193* 80 - 99 mg/dL Final   . POC Glucose 42/59/5638 253* 80 - 99 mg/dL Final   . POC Glucose 75/64/3329 130* 80 - 99 mg/dL Final   . POC Glucose 51/88/4166 170* 80 - 99 mg/dL Final   . POC Glucose 03/18/1600 212* 80 - 99 mg/dL Final   . POC Glucose 09/32/3557 207* 80 - 99 mg/dL Final   . Sodium 32/20/2542 141  135 - 143 mmol/L Final   . Potassium 09/26/2015 3.6  3.5 - 5.1  mmol/L Final   . Chloride 09/26/2015 107  98 - 111 mmol/L Final   . CO2 - Carbon Dioxide 09/26/2015 26.5  21.0 - 31.0 mmol/L Final   . Glucose 09/26/2015 138* 80 - 99 mg/dL Final   . BUN 70/62/3762 12  6 -  23 mg/dL Final   . Creatinine 16/06/9603 0.80  0.65 - 1.30 mg/dL Final   . Calcium 54/05/8118 8.4* 8.6 - 10.3 mg/dL Final   . Anion Gap 14/78/2956 7.5  3.0 - 11.0 mmol/L Final   . GFR Estimate 09/26/2015 >60  >=60 mL/min/1.22m*2 Final   . GFR Additional Info 09/26/2015    Final   . WBC 09/26/2015 10.2  4.8 - 10.8 10*3/?L Final   . RBC 09/26/2015 4.79  4.70 - 6.10 10*6/?L Final   . Hemoglobin 09/26/2015 14.0  12.0 - 18.0 g/dL Final   . HCT 21/30/8657 42.0  42.0 - 54.0 % Final   . MCV 09/26/2015 87.7  81.0 - 99.0 fL Final   . MCH 09/26/2015 29.3  27.0 - 34.0 pg Final   . MCHC 09/26/2015 33.4  32.0 - 36.0 g/dL Final   . RDW 84/69/6295 13.0  11.5 - 14.5 % Final   . Platelet Count 09/26/2015 177  150 - 400 10*3/?L Final   . MPV 09/26/2015 8.9  7.4 - 10.4 fL Final   . Neutrophils % 09/26/2015 72* 35 - 70 % Final   . Bands % 09/26/2015 1  0 - 10 % Final   . Lymphocytes % 09/26/2015 20* 25 - 45 % Final   . Monocytes % 09/26/2015 7  0 - 12 % Final   . Eosinophils % 09/26/2015 0  0 - 7 % Final   . Basophils % 09/26/2015 0  0 - 2 % Final   . Neutrophils, Absolute 09/26/2015 7.4* 1.6 - 7.3 10*3/?L Final   . Bands, Absolute 09/26/2015 0.1  0.0 - 1.2 10*3/?L Final   . Lymphocytes, Absolute 09/26/2015 2.0  1.1 - 4.3 10*3/?L Final   . Monocytes, Absolute 09/26/2015 0.7  0.0 - 1.2 10*3/?L Final   . Eosinophils, Absolute 09/26/2015 0.0  0.0 - 0.7 10*3/?L Final   . Basophils, Absolute 09/26/2015 0.0  0.0 - 0.2 10*3/?L Final   . Differential Type 09/26/2015 Manual Differential   Final   . Platelet Estimate 09/26/2015 Normal  Normal Final   . RBC Morphology 09/26/2015 Normal   Final   . POC Glucose 09/26/2015 121* 80 - 99 mg/dL Final   . POC Glucose 28/41/3244 150* 80 - 99 mg/dL Final   Office Visit on 09/16/2015   Component Date  Value Ref Range Status   . SOP Peak Flow 09/17/2015 250  % Final   Abstract on 08/24/2015   Component Date Value Ref Range Status   . HM PSA 03/19/2014 not sure of exact time frame or result   Final   . HM Colonoscopy 09/19/2010 all good   Final       REVIEW OF SYSTEMS   Review of Systems   Constitutional: Negative for appetite change, chills, fatigue and fever.   HENT: Positive for congestion. Negative for sore throat, trouble swallowing and voice change.    Respiratory: Positive for cough, shortness of breath and wheezing.    Cardiovascular: Negative for chest pain, palpitations and leg swelling.   Gastrointestinal: Negative for abdominal pain, diarrhea, nausea and vomiting.   Musculoskeletal: Negative for back pain.   Neurological: Negative for syncope, weakness, light-headedness and headaches.   Psychiatric/Behavioral: Negative for sleep disturbance.       PHYSICAL EXAM     Visit Vitals   . BP 100/60   . Pulse 83   . Temp 36.4 ?C (97.6 ?F) (Oral)   . Ht 5' 10 (1.778 m)   .  Wt 186 lb 14.4 oz (84.8 kg)   . SpO2 96%   . BMI 26.82 kg/m2     Body mass index is 26.82 kg/(m^2).  Physical Exam   Constitutional: He appears well-developed and well-nourished.   HENT:   Head: Normocephalic.   Eyes: Pupils are equal, round, and reactive to light.   Neck: Normal range of motion. Neck supple. No JVD present.   Cardiovascular: Normal rate and regular rhythm.    Pulmonary/Chest: Effort normal. No stridor. No respiratory distress. He has wheezes in the right upper field and the left upper field. He has no rales. He exhibits no tenderness.   Prolonged expiratory phase         ASSESSMENT & PLAN     Problem List Items Addressed This Visit        Respiratory    COPD with exacerbation (CMS/HCC) - Primary  We discussed the importance of tobacco cessation taking full course of medications red flag precautions and warnings were given. He is aware of the significant health detriment that his smoking status poses on his health but is not  interested in any help with cessation he states he is doing well this time and may be able to quit on his own         TRANSITIONAL CARE MANAGEMENT CERTIFICATION:  I certify the following are true:  1. Communication with the patient was made within 2 business days of discharge.  2. Complexity of Medical decision making is low.  3. Face to face visit occurred within 5 days of discharge.    Current Outpatient Prescriptions:   .  albuterol (PROVENTIL HFA;VENTOLIN HFA) 90 mcg/actuation inhaler, Inhale 2 puffs into the lungs every 4 (four) hours as needed for Wheezing for up to 30 days., Disp: 1 Inhaler, Rfl: 2  .  albuterol (PROVENTIL) 2.5 mg /3 mL (0.083 %) nebulizer solution, Take 3 mLs (2.5 mg total) by nebulization every 6 (six) hours as needed for Wheezing or Shortness of Breath., Disp: 360 mL, Rfl: 2  .  amLODIPine (NORVASC) 10 MG tablet, Take 10 mg by mouth daily., Disp: , Rfl:   .  aspirin 81 MG EC tablet, Take 81 mg by mouth daily., Disp: , Rfl:   .  benzonatate (TESSALON) 100 MG capsule, Take 100 mg by mouth 3 (three) times daily as needed for Cough., Disp: , Rfl:   .  budesonide (PULMICORT) 0.5 mg/2 mL nebulizer suspension, Take 2 mLs (0.5 mg total) by nebulization every 12 (twelve) hours for 30 days., Disp: 60 mL, Rfl: 5  .  buPROPion (WELLBUTRIN SR) 150 MG 12 hr tablet, Take 1 tablet PO daily for 3 days, then 1 tablet PO twice daily for 12 weeks., Disp: 171 tablet, Rfl: 0  .  ipratropium-albuterol (DUO-NEB) 0.5 mg-3 mg(2.5 mg base)/3 mL nebulizer solution, Take 3 mLs by nebulization 4 (four) times daily for 30 days., Disp: 120 each, Rfl: 0  .  isosorbide mononitrate (IMDUR) 30 MG 24 hr tablet, Take 1 tablet (30 mg total) by mouth daily., Disp: 30 tablet, Rfl: 11  .  losartan (COZAAR) 100 MG tablet, Take 1 tablet (100 mg total) by mouth daily., Disp: 30 tablet, Rfl: 11  .  metFORMIN (GLUCOPHAGE) 1000 MG tablet, Take 1,000 mg by mouth 2 (two) times daily with meals., Disp: , Rfl:   .  metoprolol (LOPRESSOR) 50  MG tablet, Take 1 tablet (50 mg total) by mouth 2 (two) times daily., Disp: 60 tablet, Rfl: 11  .  nicotine polacrilex (NICORETTE) 2 mg gum, Take 1 each (2 mg total) by mouth as needed for Smoking cessation for up to 30 days., Disp: 100 tablet, Rfl: 0  .  nitroglycerin (NITROSTAT) 0.4 MG SL tablet, Place 0.4 mg under the tongue every 5 (five) minutes as needed for Chest pain., Disp: , Rfl:   .  pantoprazole (PROTONIX) 40 MG tablet, Take 40 mg by mouth daily., Disp: , Rfl:   .  predniSONE (DELTASONE) 20 MG tablet, Take 1 tablet (20 mg total) by mouth daily for 7 days., Disp: 7 tablet, Rfl: 0  .  rosuvastatin (CRESTOR) 20 MG tablet, Take 20 mg by mouth nightly. , Disp: , Rfl:   .  tiotropium-olodaterol (STIOLTO RESPIMAT) 2.5-2.5 mcg/actuation Mist, Inhale 2 puffs into the lungs daily., Disp: 4 g, Rfl: 0

## 2015-10-02 NOTE — Patient Instructions (Signed)
Chronic Obstructive Pulmonary Disease  Chronic obstructive pulmonary disease (COPD) is a common lung condition in which airflow from the lungs is limited. COPD is a general term that can be used to describe many different lung problems that limit airflow, including both chronic bronchitis and emphysema. If you have COPD, your lung function will probably never return to normal, but there are measures you can take to improve lung function and make yourself feel better.  CAUSES   · Smoking (common).  · Exposure to secondhand smoke.  · Genetic problems.  · Chronic inflammatory lung diseases or recurrent infections.  SYMPTOMS  · Shortness of breath, especially with physical activity.  · Deep, persistent (chronic) cough with a large amount of thick mucus.  · Wheezing.  · Rapid breaths (tachypnea).  · Gray or bluish discoloration (cyanosis) of the skin, especially in your fingers, toes, or lips.  · Fatigue.  · Weight loss.  · Frequent infections or episodes when breathing symptoms become much worse (exacerbations).  · Chest tightness.  DIAGNOSIS  Your health care provider will take a medical history and perform a physical examination to diagnose COPD. Additional tests for COPD may include:  · Lung (pulmonary) function tests.  · Chest X-ray.  · CT scan.  · Blood tests.  TREATMENT   Treatment for COPD may include:  · Inhaler and nebulizer medicines. These help manage the symptoms of COPD and make your breathing more comfortable.  · Supplemental oxygen. Supplemental oxygen is only helpful if you have a low oxygen level in your blood.  · Exercise and physical activity. These are beneficial for nearly all people with COPD.  · Lung surgery or transplant.  · Nutrition therapy to gain weight, if you are underweight.  · Pulmonary rehabilitation. This may involve working with a team of health care providers and specialists, such as respiratory, occupational, and physical therapists.  HOME CARE INSTRUCTIONS  · Take all medicines  (inhaled or pills) as directed by your health care provider.  · Avoid over-the-counter medicines or cough syrups that dry up your airway (such as antihistamines) and slow down the elimination of secretions unless instructed otherwise by your health care provider.  · If you are a smoker, the most important thing that you can do is stop smoking. Continuing to smoke will cause further lung damage and breathing trouble. Ask your health care provider for help with quitting smoking. He or she can direct you to community resources or hospitals that provide support.  · Avoid exposure to irritants such as smoke, chemicals, and fumes that aggravate your breathing.  · Use oxygen therapy and pulmonary rehabilitation if directed by your health care provider. If you require home oxygen therapy, ask your health care provider whether you should purchase a pulse oximeter to measure your oxygen level at home.  · Avoid contact with individuals who have a contagious illness.  · Avoid extreme temperature and humidity changes.  · Eat healthy foods. Eating smaller, more frequent meals and resting before meals may help you maintain your strength.  · Stay active, but balance activity with periods of rest. Exercise and physical activity will help you maintain your ability to do things you want to do.  · Preventing infection and hospitalization is very important when you have COPD. Make sure to receive all the vaccines your health care provider recommends, especially the pneumococcal and influenza vaccines. Ask your health care provider whether you need a pneumonia vaccine.  · Learn and use relaxation techniques to manage   stress.  · Learn and use controlled breathing techniques as directed by your health care provider. Controlled breathing techniques include:    Pursed lip breathing. Start by breathing in (inhaling) through your nose for 1 second. Then, purse your lips as if you were going to whistle and breathe out (exhale) through the  pursed lips for 2 seconds.    Diaphragmatic breathing. Start by putting one hand on your abdomen just above your waist. Inhale slowly through your nose. The hand on your abdomen should move out. Then purse your lips and exhale slowly. You should be able to feel the hand on your abdomen moving in as you exhale.  · Learn and use controlled coughing to clear mucus from your lungs. Controlled coughing is a series of short, progressive coughs. The steps of controlled coughing are:  . Lean your head slightly forward.  . Breathe in deeply using diaphragmatic breathing.  . Try to hold your breath for 3 seconds.  . Keep your mouth slightly open while coughing twice.  . Spit any mucus out into a tissue.  . Rest and repeat the steps once or twice as needed.  SEEK MEDICAL CARE IF:  · You are coughing up more mucus than usual.  · There is a change in the color or thickness of your mucus.  · Your breathing is more labored than usual.  · Your breathing is faster than usual.  SEEK IMMEDIATE MEDICAL CARE IF:  · You have shortness of breath while you are resting.  · You have shortness of breath that prevents you from:    Being able to talk.    Performing your usual physical activities.  · You have chest pain lasting longer than 5 minutes.  · Your skin color is more cyanotic than usual.  · You measure low oxygen saturations for longer than 5 minutes with a pulse oximeter.  MAKE SURE YOU:  · Understand these instructions.  · Will watch your condition.  · Will get help right away if you are not doing well or get worse.     This information is not intended to replace advice given to you by your health care provider. Make sure you discuss any questions you have with your health care provider.     Document Released: 06/15/2005 Document Revised: 09/26/2014 Document Reviewed: 05/02/2013  Elsevier Interactive Patient Education ©2016 [employer]

## 2015-10-05 NOTE — Telephone Encounter (Signed)
Voicemail from Ester @ the [facility]. [phone]  Patients PCP is out sourced so they would like [place] to inform patients PCP of the D/C medications changes.  Patients Primary Care is Dr. Miquel Dunn.  Routing note to his Engineer, site to inform

## 2015-10-05 NOTE — Telephone Encounter (Signed)
D/C'd Nebulizer meds, after receiving encounter from Dr. Sharlene Dory MA saying he had changed medications.

## 2015-10-05 NOTE — Telephone Encounter (Signed)
Has patient's breathing returned to baseline?  No: Pt states he is getting better every day, but slowly. SOB level is slightly worse than normal. Has a productive cough but pt does not look at sputum. Pt is smoking about 4-5 cigarettes a day. Wellbutrin has been ordered for him and is going through the [place] system along with a new inhaler Stiolto. Pt received a sample of this from [facility].     Has the patient had to use rescue inhaler, if so how often?  No. Has not used since discharge.    Nita Sells, RN  Pulmonary Nurse Navigator  [phone]

## 2015-10-08 NOTE — Telephone Encounter (Signed)
Called pt regarding care call. He states, overall I'm doing wonderfully. Denies congestion or wheezing. States he has a slight cough at times. He states he always has some SOB but feels that he can catch his breath fine. He states his energy level is coming back up slowly but surely. He denies any questions or concerns at this time. Patient declined future care calls. Advised that he call the clinic if he needs anything. He verbalized understanding. Closing encounter.

## 2015-10-12 NOTE — Telephone Encounter (Signed)
Has patient's breathing returned to baseline?  No: Pt continues to feel better but not back to baseline. Pt very much hopes that this isn't it for met--I hope I don't feel like this from now on. I'm just surviving. Pt states his cough is almost gone. Pt states he has not yet received the medications from the [place]. If they don't come today, he will go out to the [facility] tomorrow. Pt then asked Am I supposed to be taking that Stiolto every day. Education again done concerning the role of control medications and rescue medications. Pt indicates understanding.     Has the patient had to use rescue inhaler, if so how often?  No    Nita Sells, RN  Pulmonary Nurse Navigator  [phone]

## 2015-10-19 NOTE — Telephone Encounter (Signed)
Has patient's breathing returned to baseline?  No: Pt states 'I'm not really improving as far as the chest goes I get lightheaded with exertion. Pt is expressing some discouragement. Pt did receive his medications from the [place] and never ran out of inhalers. Pt is taking Stiolto daily. Pt is doing everything advised by pulmonology except complete smoking cessation. Pt is aware of his need to quit My need to get better just has to get stronger than my need to smoke Pt encouraged in his efforts.    Has the patient had to use rescue inhaler, if so how often?  No    Nita Sells, RN  Pulmonary Nurse Navigator  [phone]

## 2015-11-27 MED ORDER — isosorbide mononitrate (IMDUR) 30 MG 24 hr tablet
30 | ORAL_TABLET | Freq: Every day | ORAL | 11 refills | 90.00000 days | Status: AC
Start: 2015-11-27 — End: 2016-11-26

## 2015-11-27 MED ORDER — rosuvastatin (CRESTOR) 20 MG tablet
20 | ORAL_TABLET | Freq: Every evening | ORAL | 3 refills | 90.00000 days | Status: AC
Start: 2015-11-27 — End: ?

## 2015-11-27 MED ORDER — tiotropium-olodaterol (STIOLTO RESPIMAT) 2.5-2.5 mcg/actuation Mist
2.5-2.5 | Freq: Every day | RESPIRATORY_TRACT | 0 refills | Status: DC
Start: 2015-11-27 — End: 2016-02-26

## 2015-11-27 MED ORDER — metFORMIN (GLUCOPHAGE) 1000 MG tablet
1000 | ORAL_TABLET | Freq: Two times a day (BID) | ORAL | 11 refills | 30.00000 days | Status: AC
Start: 2015-11-27 — End: ?

## 2015-11-27 MED ORDER — pantoprazole (PROTONIX) 40 MG tablet
40 | ORAL_TABLET | Freq: Every day | ORAL | 3 refills | 45.00000 days | Status: AC
Start: 2015-11-27 — End: ?

## 2015-11-27 MED ORDER — metoprolol tartrate (LOPRESSOR) 50 MG tablet
50 | ORAL_TABLET | Freq: Two times a day (BID) | ORAL | 11 refills | 90.00000 days | Status: AC
Start: 2015-11-27 — End: 2016-11-26

## 2015-11-27 MED ORDER — losartan (COZAAR) 100 MG tablet
100 | ORAL_TABLET | Freq: Every day | ORAL | 11 refills | 90.00000 days | Status: AC
Start: 2015-11-27 — End: ?

## 2015-11-27 MED ORDER — nitroglycerin (NITROSTAT) 0.4 MG SL tablet
0.4 | ORAL_TABLET | SUBLINGUAL | 11 refills | 10.00000 days | Status: DC | PRN
Start: 2015-11-27 — End: 2016-02-26

## 2015-11-27 NOTE — Telephone Encounter (Signed)
.  please advise on medications refills        Next office vist is 12/22/15 to establish

## 2015-11-27 NOTE — Telephone Encounter (Addendum)
Medication Being Requested:  See Meds and Orders.    Send To Pharmacy, or Office Pick Up? Send to [facility]    [facility]  [address] [place]  Phone: [phone] Fax: [phone]    [facility] [address] [place], [place] - [address] [address]  [address]  [place] [address]  Phone: [phone] Fax: [phone]      How many Days Remaining of medication: Unknown, but has 0 refills on these.     Additional Information:     Previous patient of Dr. Miquel Dunn. Set to establish with Josefine Class. Needing these refills authorized. Please advise. [phone].     **Informed patient that it may take up to 48-72 hours to fill prescription.**    Future Appointments:  Future Appointments  Date Time Provider Department Center   11/30/2015 10:30 AM Diya Gwenyth Bouillon, MD [facility]   12/22/2015 9:00 AM Josefine Class, FNP [id number]

## 2015-11-30 ENCOUNTER — Encounter: Payer: TRICARE (CHAMPUS) | Attending: Critical Care Medicine | Primary: Family

## 2015-11-30 NOTE — Telephone Encounter (Signed)
Cody from [employer] is calling to get clarification on patients Crestor 20mg .    Selena Batten states that patient was previously on crestor 20mg , but only took 1/2 tab at night. Was patients Crestor increased to the full 20mg ?           This was a Dr Miquel Dunn patient and is scheduled to establish with you on 12/22/15. Can you look and see what dose patient should be on?

## 2015-12-22 ENCOUNTER — Ambulatory Visit: Admit: 2015-12-22 | Discharge: 2015-12-22 | Payer: TRICARE (CHAMPUS) | Attending: Family | Primary: Family

## 2015-12-22 DIAGNOSIS — I10 Essential (primary) hypertension: Secondary | ICD-10-CM

## 2015-12-22 MED ORDER — finasteride (PROSCAR) 5 mg tablet
5 | ORAL_TABLET | Freq: Every day | ORAL | 3 refills | 30.00000 days | Status: AC
Start: 2015-12-22 — End: 2016-12-21

## 2015-12-22 MED ORDER — sildenafil (VIAGRA) 50 MG tablet
50 | ORAL_TABLET | Freq: Every day | ORAL | 11 refills | 10.00000 days | Status: DC | PRN
Start: 2015-12-22 — End: 2015-12-31

## 2015-12-22 MED ORDER — doxazosin (CARDURA) 4 MG tablet
4 | ORAL_TABLET | Freq: Every evening | ORAL | 3 refills | 90.00000 days | Status: AC
Start: 2015-12-22 — End: 2016-12-21

## 2015-12-22 NOTE — Patient Instructions (Signed)
Benign Prostatic Hypertrophy The prostate gland is part of the reproductive system of men. A normal prostate is about the size and shape of a walnut. The prostate gland produces a fluid that is mixed with sperm to make semen. This gland surrounds the urethra and is located in front of the rectum and just below the bladder. The bladder is where urine is stored. The urethra is the tube through which urine passes from the bladder to get out of the body. The prostate grows as a man ages. An enlarged prostate not caused by cancer is called benign prostatic hypertrophy (BPH). An enlarged prostate can press on the urethra. This can make it harder to pass urine. In the early stages of enlargement, the bladder can get by with a narrowed urethra by forcing the urine through. If the problem gets worse, medical or surgical treatment may be required.  This condition should be followed by your health care provider. The accumulation of urine in the bladder can cause infection. Back pressure and infection can progress to bladder damage and kidney (renal) failure. If needed, your health care provider may refer you to a specialist in kidney and prostate disease (urologist). CAUSES  BPH is a common health problem in men older than 50 years. This condition is a normal part of aging. However, not all men will develop problems from this condition. If the enlargement grows away from the urethra, then there will not be any compression of the urethra and resistance to urine flow.If the growth is toward the urethra and compresses it, you will experience difficulty urinating.  SYMPTOMS   Not able to completely empty your bladder.  Getting up often during the night to urinate.  Need to urinate frequently during the day.  Difficultly starting urine flow.  Decrease in size and strength of your urine stream.  Dribbling after urination.  Pain on urination (more common with infection).  Inability to pass urine. This needs  immediate treatment.  The development of a urinary tract infection. DIAGNOSIS  These tests will help your health care provider understand your problem:  A thorough history and physical examination.  A urination history, with the number of times you urinate, the amounts of urine, the strength of the urine stream, and the feeling of emptiness or fullness after urinating.  A postvoid bladder scan that measures any amount of urine that may remain in your bladder after you finish urinating.  Digital rectal exam. In a rectal exam, your health care provider checks your prostate by putting a gloved, lubricated finger into your rectum to feel the back of your prostate gland. This exam detects the size of your gland and abnormal lumps or growths.  Exam of your urine (urinalysis).  Prostate specific antigen (PSA) screening. This is a blood test used to screen for prostate cancer.  Rectal ultrasonography. This test uses sound waves to electronically produce a picture of your prostate gland. TREATMENT  Once symptoms begin, your health care provider will monitor your condition. Of the men with this condition, one third will have symptoms that stabilize, one third will have symptoms that improve, and one third will have symptoms that progress in the first year. Mild symptoms may not need treatment. Simple observation and yearly exams may be all that is required. Medicines and surgery are options for more severe problems. Your health care provider can help you make an informed decision for what is best. Two classes of medicines are available for relief of prostate symptoms:  Medicines   that shrink the prostate. This helps relieve symptoms. These medicines take time to work, and it may be months before any improvement is seen.  Uncommon side effects include problems with sexual function.  Medicines to relax the muscle of the prostate. This also relieves the obstruction by reducing any compression on the  urethra.This group of medicines work much faster than those that reduce the size of the prostate gland. Usually, one can experience improvement in days to weeks..  Side effects can include dizziness, fatigue, lightheadedness, and retrograde ejaculation (diminished volume of ejaculate). Several types of surgical treatments are available for relief of prostate symptoms:  Transurethral resection of the prostate (TURP)--In this treatment, an instrument is inserted through opening at the tip of the penis. It is used to cut away pieces of the inner core of the prostate. The pieces are removed through the same opening of the penis. This removes the obstruction and helps get rid of the symptoms.  Transurethral incision (TUIP)--In this procedure, small cuts are made in the prostate. This lessens the prostates pressure on the urethra.  Transurethral microwave thermotherapy (TUMT)--This procedure uses microwaves to create heat. The heat destroys and removes a small amount of prostate tissue.  Transurethral needle ablation (TUNA)--This is a procedure that uses radio frequencies to do the same as TUMT.  Interstitial laser coagulation (ILC)--This is a procedure that uses a laser to do the same as TUMT and TUNA.  Transurethral electrovaporization (TUVP)--This is a procedure that uses electrodes to do the same as the procedures listed above. SEEK MEDICAL CARE IF:   You develop a fever.  There is unexplained back pain.  Symptoms are not helped by medicines prescribed.  You develop side effects from the medicine you are taking.  Your urine becomes very dark or has a bad smell.  Your lower abdomen becomes distended and you have difficulty passing your urine. SEEK IMMEDIATE MEDICAL CARE IF:   You are suddenly unable to urinate. This is an emergency. You should be seen immediately.  There are large amounts of blood or clots in the urine.  Your urinary problems become unmanageable.  You develop  lightheadedness, severe dizziness, or you feel faint.  You develop moderate to severe low back or flank pain.  You develop chills or fever.   This information is not intended to replace advice given to you by your health care provider. Make sure you discuss any questions you have with your health care provider.   Document Released: 09/05/2005 Document Revised: 09/10/2013 Document Reviewed: 03/21/2013 [employer] Interactive Patient Education 2016 [employer]  

## 2015-12-22 NOTE — Progress Notes (Signed)
Charles [name] is a 70 y.o. male.   Chief Complaint   Patient presents with   . Physical     new patient transfer from Charles [name]        HISTORY OF PRESENT ILLNESS   HPI  Charles [name] is here today to establish care and discuss past medical history which includes COPD, diabetes, hyperlipidemia hypertension and angina    COPD  He is a current every day for her pack smoker and has a history of COPD for which he is taking stiolto and albuterol. He denies any wheezing or shortness of breath or DOE.    Hyperlipidemia  She's been taking Crestor 20 mg daily tells me he was taking half tab in the past. He denies any myalgias he has not had lipid panel in over a year and a half.    Diabetes  Type 2 diabetes currently taking metformin 1000 mg twice daily he tells me blood sugars have been slightly elevated over the last several months sometimes they go as high as 200. He has not had an A1c recently. Denies any hypoglycemic events.    Hypertension angina  History of hypertension for which he is taking amlodipine and metoprolol. He also takes Imdur for typical angina. He has a history of ED and takes Levitra for this he tells me that if he is going to take a dose of the Levitra he does not take the Imdur the following day this is how his cardiologist told him to manage his medication.    BPH ED  He has symptoms consistent with L UTS and has taken Flomax in the past which provides moderate relief he has had recurrence of her bleeding and hesitancy over the last few months. He has not had a PSA he believes. Denies any dysuria hematuria fever chills or flank pain. He would like a prescription for medication to help with symptoms.    No problems updated.  Past Medical History:   Diagnosis Date   . Angina at rest (CMS/HCC)    . Arrhythmia     years ago, EPISODES of vtach whole life   . COPD (chronic obstructive pulmonary disease) (CMS/HCC)    . Coronary artery disease    . Diabetes mellitus (CMS/HCC) 2013   . Fatigue    . GERD  (gastroesophageal reflux disease)     for years   . Hyperlipidemia    . Hypertension 2006   . Interstitial lung disease (CMS/HCC)    . Narcolepsy and cataplexy    . NSTEMI (non-ST elevated myocardial infarction) (CMS/HCC) 08/2006 with stent mid left circumflex   . Stroke (CMS/HCC) 1980's    had neuro changes but resolved     Past Surgical History:   Procedure Laterality Date   . CARDIAC CATHETERIZATION  2016   . CORONARY STENT PLACEMENT Left mid circumflex    08/2006   . eye surger      Tear duct surgery   . TONSILLECTOMY      as a child     Allergies   Allergen Reactions   . Gabapentin Other (See Comments)     Yellowing of skin.   . Iodine And Iodide Containing Products Anaphylaxis     CONTRAST DYE used years ago for heart cath     Social History     Social History Main Topics   . Smoking status: Current Every Day Smoker     Packs/day: 0.25     Years: 52.00   .  Smokeless tobacco: Former Neurosurgeon      Comment: Charles [name] discussed smoking cessation with visit.   Marland Kitchen Alcohol use No   . Drug use: No   . Sexual activity: Yes     Partners: Female     Social History   . Marital status: Significant Other     Spouse name: N/A   . Number of children: 3   . Years of education: N/A     Other Topics Concern   . Exercise Yes     a little     Family History   Problem Relation Age of Onset   . Heart attack Mother    . Heart disease Mother    . Stroke Mother    . Heart failure Mother    . Heart disease Father    . Heart failure Father      Admission on 09/23/2015, Discharged on 09/26/2015   Component Date Value Ref Range Status   . WBC 09/23/2015 10.7  4.8 - 10.8 10*3/?L Final   . RBC 09/23/2015 5.26  4.70 - 6.10 10*6/?L Final   . Hemoglobin 09/23/2015 15.4  12.0 - 18.0 g/dL Final   . HCT 57/84/6962 46.1  42.0 - 54.0 % Final   . MCV 09/23/2015 87.5  81.0 - 99.0 fL Final   . MCH 09/23/2015 29.3  27.0 - 34.0 pg Final   . MCHC 09/23/2015 33.5  32.0 - 36.0 g/dL Final   . RDW 95/28/4132 13.3  11.5 - 14.5 % Final   . Platelet Count 09/23/2015  183  150 - 400 10*3/?L Final   . MPV 09/23/2015 8.7  7.4 - 10.4 fL Final   . Neutrophils % 09/23/2015 45  35 - 70 % Final   . Lymphocytes % 09/23/2015 46* 25 - 45 % Final   . Monocytes % 09/23/2015 8  0 - 12 % Final   . Eosinophils % 09/23/2015 0  0 - 7 % Final   . Basophils % 09/23/2015 1  0 - 2 % Final   . Neutrophils, Absolute 09/23/2015 4.8  1.6 - 7.3 10*3/?L Final   . Lymphocytes, Absolute 09/23/2015 4.9* 1.1 - 4.3 10*3/?L Final   . Monocytes, Absolute 09/23/2015 0.8  0.0 - 1.2 10*3/?L Final   . Eosinophils, Absolute 09/23/2015 0.0  0.0 - 0.7 10*3/?L Final   . Basophils, Absolute 09/23/2015 0.1  0.0 - 0.2 10*3/?L Final   . Differential Type 09/23/2015 Automated Differential   Final   . Sodium 09/23/2015 135  135 - 143 mmol/L Final   . Potassium 09/23/2015 3.9  3.5 - 5.1 mmol/L Final   . Chloride 09/23/2015 102  98 - 111 mmol/L Final   . CO2 - Carbon Dioxide 09/23/2015 23.1  21.0 - 31.0 mmol/L Final   . Glucose 09/23/2015 186* 80 - 99 mg/dL Final   . BUN 44/09/270 17  6 - 23 mg/dL Final   . Creatinine 53/66/4403 1.01  0.65 - 1.30 mg/dL Final   . Calcium 47/42/5956 9.6  8.6 - 10.3 mg/dL Final   . AST - Aspartate Aminotransferase 09/23/2015 27  10 - 50 IU/L Final   . ALT - Alanine Amino transferase 09/23/2015 50  7 - 52 IU/L Final   . Alkaline Phosphatase 09/23/2015 55  34 - 104 IU/L Final   . Bilirubin Total 09/23/2015 0.4  0.3 - 1.2 mg/dL Final   . Protein Total 09/23/2015 6.5  6.0 - 8.0 g/dL Final   . Albumin 38/75/6433 4.2  3.5 - 5.0 g/dL Final   . Globulin 16/06/9603 2.3  2.2 - 3.7 g/dL Final   . Albumin/Globulin Ratio 09/23/2015 1.8  >0.9 Final   . Anion Gap 09/23/2015 9.9  3.0 - 11.0 mmol/L Final   . GFR Estimate 09/23/2015 >60  >=60 mL/min/1.56m*2 Final   . GFR Additional Info 09/23/2015    Final   . Lactic Acid ED 09/23/2015 1.4  0.5 - 2.2 mmol/L Final   . Blood Culture Result 09/23/2015 No Growth   Final   . Blood Culture Result 09/23/2015 No Growth   Final   . Influenza A 09/23/2015 Influenza A Viral  RNA Not Detected  Influenza A Viral RNA Not Detected Final   . Influenza B 09/23/2015 Influenza B Viral RNA Not Detected  Influenza B Viral RNA Not Detected Final   . D Dimer, Quantitative 09/23/2015 507* 100 - 500 ng/mL FEU Final   . WBC 09/24/2015 6.3  4.8 - 10.8 10*3/?L Final   . RBC 09/24/2015 4.79  4.70 - 6.10 10*6/?L Final   . Hemoglobin 09/24/2015 14.5  12.0 - 18.0 g/dL Final   . HCT 54/05/8118 41.8* 42.0 - 54.0 % Final   . MCV 09/24/2015 87.3  81.0 - 99.0 fL Final   . MCH 09/24/2015 30.2  27.0 - 34.0 pg Final   . MCHC 09/24/2015 34.6  32.0 - 36.0 g/dL Final   . RDW 14/78/2956 13.1  11.5 - 14.5 % Final   . Platelet Count 09/24/2015 164  150 - 400 10*3/?L Final   . MPV 09/24/2015 8.8  7.4 - 10.4 fL Final   . Neutrophils % 09/24/2015 66  35 - 70 % Final   . Lymphocytes % 09/24/2015 32  25 - 45 % Final   . Monocytes % 09/24/2015 2  0 - 12 % Final   . Eosinophils % 09/24/2015 0  0 - 7 % Final   . Basophils % 09/24/2015 0  0 - 2 % Final   . Neutrophils, Absolute 09/24/2015 4.1  1.6 - 7.3 10*3/?L Final   . Lymphocytes, Absolute 09/24/2015 2.0  1.1 - 4.3 10*3/?L Final   . Monocytes, Absolute 09/24/2015 0.2  0.0 - 1.2 10*3/?L Final   . Eosinophils, Absolute 09/24/2015 0.0  0.0 - 0.7 10*3/?L Final   . Basophils, Absolute 09/24/2015 0.0  0.0 - 0.2 10*3/?L Final   . Differential Type 09/24/2015 Automated Differential   Final   . Sodium 09/24/2015 138  135 - 143 mmol/L Final   . Potassium 09/24/2015 4.2  3.5 - 5.1 mmol/L Final   . Chloride 09/24/2015 108  98 - 111 mmol/L Final   . CO2 - Carbon Dioxide 09/24/2015 22.4  21.0 - 31.0 mmol/L Final   . Glucose 09/24/2015 169* 80 - 99 mg/dL Final   . BUN 21/30/8657 13  6 - 23 mg/dL Final   . Creatinine 84/69/6295 0.71  0.65 - 1.30 mg/dL Final   . Calcium 28/41/3244 8.4* 8.6 - 10.3 mg/dL Final   . Anion Gap 09/21/7251 7.6  3.0 - 11.0 mmol/L Final   . GFR Estimate 09/24/2015 >60  >=60 mL/min/1.60m*2 Final   . GFR Additional Info 09/24/2015    Final   . POC Glucose 09/24/2015 171*  80 - 99 mg/dL Final   . POC Glucose 66/44/0347 164* 80 - 99 mg/dL Final   . POC Glucose 42/59/5638 193* 80 - 99 mg/dL Final   . POC Glucose 75/64/3329 253* 80 - 99 mg/dL Final   .  POC Glucose 09/25/2015 130* 80 - 99 mg/dL Final   . POC Glucose 16/06/9603 170* 80 - 99 mg/dL Final   . POC Glucose 54/05/8118 212* 80 - 99 mg/dL Final   . POC Glucose 14/78/2956 207* 80 - 99 mg/dL Final   . Sodium 21/30/8657 141  135 - 143 mmol/L Final   . Potassium 09/26/2015 3.6  3.5 - 5.1 mmol/L Final   . Chloride 09/26/2015 107  98 - 111 mmol/L Final   . CO2 - Carbon Dioxide 09/26/2015 26.5  21.0 - 31.0 mmol/L Final   . Glucose 09/26/2015 138* 80 - 99 mg/dL Final   . BUN 84/69/6295 12  6 - 23 mg/dL Final   . Creatinine 28/41/3244 0.80  0.65 - 1.30 mg/dL Final   . Calcium 09/21/7251 8.4* 8.6 - 10.3 mg/dL Final   . Anion Gap 66/44/0347 7.5  3.0 - 11.0 mmol/L Final   . GFR Estimate 09/26/2015 >60  >=60 mL/min/1.38m*2 Final   . GFR Additional Info 09/26/2015    Final   . WBC 09/26/2015 10.2  4.8 - 10.8 10*3/?L Final   . RBC 09/26/2015 4.79  4.70 - 6.10 10*6/?L Final   . Hemoglobin 09/26/2015 14.0  12.0 - 18.0 g/dL Final   . HCT 42/59/5638 42.0  42.0 - 54.0 % Final   . MCV 09/26/2015 87.7  81.0 - 99.0 fL Final   . MCH 09/26/2015 29.3  27.0 - 34.0 pg Final   . MCHC 09/26/2015 33.4  32.0 - 36.0 g/dL Final   . RDW 75/64/3329 13.0  11.5 - 14.5 % Final   . Platelet Count 09/26/2015 177  150 - 400 10*3/?L Final   . MPV 09/26/2015 8.9  7.4 - 10.4 fL Final   . Neutrophils % 09/26/2015 72* 35 - 70 % Final   . Bands % 09/26/2015 1  0 - 10 % Final   . Lymphocytes % 09/26/2015 20* 25 - 45 % Final   . Monocytes % 09/26/2015 7  0 - 12 % Final   . Eosinophils % 09/26/2015 0  0 - 7 % Final   . Basophils % 09/26/2015 0  0 - 2 % Final   . Neutrophils, Absolute 09/26/2015 7.4* 1.6 - 7.3 10*3/?L Final   . Bands, Absolute 09/26/2015 0.1  0.0 - 1.2 10*3/?L Final   . Lymphocytes, Absolute 09/26/2015 2.0  1.1 - 4.3 10*3/?L Final   . Monocytes, Absolute 09/26/2015  0.7  0.0 - 1.2 10*3/?L Final   . Eosinophils, Absolute 09/26/2015 0.0  0.0 - 0.7 10*3/?L Final   . Basophils, Absolute 09/26/2015 0.0  0.0 - 0.2 10*3/?L Final   . Differential Type 09/26/2015 Manual Differential   Final   . Platelet Estimate 09/26/2015 Normal  Normal Final   . RBC Morphology 09/26/2015 Normal   Final   . POC Glucose 09/26/2015 121* 80 - 99 mg/dL Final   . POC Glucose 51/88/4166 150* 80 - 99 mg/dL Final       REVIEW OF SYSTEMS   Review of Systems   Constitutional: Negative for chills, diaphoresis, fatigue and fever.   Respiratory: Negative for shortness of breath.    Cardiovascular: Negative for chest pain and leg swelling.   Genitourinary: Positive for decreased urine volume and frequency. Negative for dysuria and flank pain.   Skin: Negative for rash and wound.   Neurological: Negative for dizziness, tremors, seizures, weakness and headaches.   Hematological: Negative for adenopathy. Does not bruise/bleed easily.   Psychiatric/Behavioral: Negative for  sleep disturbance.       PHYSICAL EXAM     Visit Vitals   . BP 110/70   . Pulse 69   . Temp 36.1 ?C (97 ?F) (Oral)   . Ht 5' 10 (1.778 m)   . Wt 194 lb 12.8 oz (88.4 kg)   . SpO2 97%   . BMI 27.95 kg/m2     Body mass index is 27.95 kg/(m^2).  Physical Exam   Constitutional: He is oriented to person, place, and time. He appears well-developed and well-nourished. No distress.   HENT:   Head: Normocephalic and atraumatic.   Right Ear: External ear normal.   Left Ear: External ear normal.   Eyes: Conjunctivae are normal. Pupils are equal, round, and reactive to light.   Cardiovascular: Normal rate and regular rhythm.    Pulmonary/Chest: Effort normal and breath sounds normal. He has no wheezes.   Neurological: He is alert and oriented to person, place, and time.   Skin: Skin is warm and dry. He is not diaphoretic.   Psychiatric: He has a normal mood and affect. His behavior is normal. Judgment and thought content normal.   Nursing note and vitals  reviewed.        ASSESSMENT & PLAN     Problem List Items Addressed This Visit        Cardiovascular and Mediastinum    Typical angina (CMS/HCC)    Relevant Medications    sildenafil (VIAGRA) 50 MG tablet    doxazosin (CARDURA) 4 MG tablet  Symptoms are well-controlled he is currently using Imdur for control of chest pain. Blood pressure today is 110/70 he monitors his blood pressure at home denies any ongoing uncontrolled chest pain.    Benign essential hypertension - Primary    Relevant Medications    sildenafil (VIAGRA) 50 MG tablet    doxazosin (CARDURA) 4 MG tablet  As above he has no problems with orthostatic hypotension and tolerates these medications well. This is otherwise stable although he would benefit from alpha 5.        Endocrine    Type 2 diabetes mellitus treated without insulin (CMS/HCC)    Relevant Orders    CBC with Auto Differential -Routine    Comprehensive Metabolic Panel -Routine    Glyco-Hemoglobin A1C -Routine    Thyroid Stimulating Hormone -Routine    Ambulatory referral to [facility]hthal[facility])  We will obtain A1c and lipid panel as well as CMP continue current treatment plan this is otherwise stable        Other    Dyslipidemia    Relevant Orders    Coronary Risk Lipid Panel w/reflex Direct LDL if triglycerides >400 mg/dL -Routine  We discussed use of statin with diabetes and its role in preventing CAD he will watch for any myalgias and report if they occur. We will recheck in 6 months       Other Visit Diagnoses     Benign prostatic hyperplasia, presence of lower urinary tract symptoms unspecified, unspecified morphology        Relevant Medications    finasteride (PROSCAR) 5 mg tablet    doxazosin (CARDURA) 4 MG tablet    Other Relevant Orders    PSA Diagnostic -Routine  He is not sure if he has had PSA as most of his care was primarily from [place] and he has not been seen there for about 2 years. We will provide copies of lab requisition and print out of medication per  patient  request.     Erectile dysfunction, unspecified erectile dysfunction type        Relevant Medications    sildenafil (VIAGRA) 50 MG tablet  We will provide above medication he does not take these if he is taking his Imdur. He holds the Imdur dose the following day. This is worked for him in the past and this was the recommendation per his cardiologist. We will also add the doxazosin and finasteride. We discussed use of this medication expected length of treatment before improvement in symptoms to report.     Screening for endocrine, nutritional, metabolic and immunity disorder        Relevant Orders    25-OH Vitamin D Total D2+D3 -Routine  We will obtain labs and screening labs as well. Discussed use of of these labs and we will call with results when they return.       COPD  Symptoms. Well-controlled today he is taking stiolto and albuterol as needed. To be doing well no wheezing today no complaints shortness of breath, DOE or cough continues to do well we will see him back in a few months and reexplore when spring comes      Current Outpatient Prescriptions:   .  albuterol (PROVENTIL HFA;VENTOLIN HFA) 90 mcg/actuation inhaler, Inhale 2 puffs into the lungs every 4 (four) hours as needed for Wheezing for up to 30 days., Disp: 1 Inhaler, Rfl: 2  .  amLODIPine (NORVASC) 10 MG tablet, Take 10 mg by mouth daily., Disp: , Rfl:   .  aspirin 81 MG EC tablet, Take 81 mg by mouth daily., Disp: , Rfl:   .  isosorbide mononitrate (IMDUR) 30 MG 24 hr tablet, Take 1 tablet (30 mg total) by mouth daily., Disp: 30 tablet, Rfl: 11  .  losartan (COZAAR) 100 MG tablet, Take 1 tablet (100 mg total) by mouth daily., Disp: 30 tablet, Rfl: 11  .  metFORMIN (GLUCOPHAGE) 1000 MG tablet, Take 1 tablet (1,000 mg total) by mouth 2 (two) times daily with meals., Disp: 60 tablet, Rfl: 11  .  metoprolol tartrate (LOPRESSOR) 50 MG tablet, Take 1 tablet (50 mg total) by mouth 2 (two) times daily., Disp: 60 tablet, Rfl: 11  .  nitroglycerin  (NITROSTAT) 0.4 MG SL tablet, Place 1 tablet (0.4 mg total) under the tongue every 5 (five) minutes as needed for Chest pain., Disp: 25 tablet, Rfl: 11  .  pantoprazole (PROTONIX) 40 MG tablet, Take 1 tablet (40 mg total) by mouth daily., Disp: 90 tablet, Rfl: 3  .  rosuvastatin (CRESTOR) 20 MG tablet, Take 1 tablet (20 mg total) by mouth nightly., Disp: 90 tablet, Rfl: 3  .  tiotropium-olodaterol (STIOLTO RESPIMAT) 2.5-2.5 mcg/actuation Mist, Inhale 2 puffs into the lungs daily., Disp: 4 g, Rfl: 0  .  doxazosin (CARDURA) 4 MG tablet, Take 1 tablet (4 mg total) by mouth nightly., Disp: 90 tablet, Rfl: 3  .  finasteride (PROSCAR) 5 mg tablet, Take 1 tablet (5 mg total) by mouth daily., Disp: 90 tablet, Rfl: 3  .  sildenafil (VIAGRA) 50 MG tablet, Take 1 tablet (50 mg total) by mouth daily as needed for Erectile Dysfunction for up to 30 days., Disp: 10 tablet, Rfl: 11

## 2015-12-29 NOTE — Telephone Encounter (Signed)
Requested copy of primary care authorization be sent again.  We never received this.  This has already been requested but never received.  Fax should be coming to the contact center referral queue.

## 2015-12-31 MED ORDER — sildenafil (REVATIO) 20 mg tablet
20 | ORAL_TABLET | Freq: Every day | ORAL | 11 refills | 10.00000 days | Status: DC
Start: 2015-12-31 — End: 2016-02-26

## 2015-12-31 NOTE — Telephone Encounter (Signed)
Referral transcribed.  DOS starting 08/27/15.  All appts with our clinic have been attached.

## 2015-12-31 NOTE — Telephone Encounter (Signed)
Called patient informing [facility] did not approved for him to ger Prescription for Viagra also informed  that we have a sample for Viagra for him to pick up in office and another prescription with coupon that he could use for pharmacy [facility]  Patient stated he does not want sample or prescription. Closing encounter

## 2016-02-26 ENCOUNTER — Inpatient Hospital Stay: Admit: 2016-02-26 | Discharge: 2016-02-26 | Payer: TRICARE (CHAMPUS) | Attending: Family | Primary: Family

## 2016-02-26 ENCOUNTER — Ambulatory Visit: Admit: 2016-02-26 | Discharge: 2016-02-26 | Payer: TRICARE (CHAMPUS) | Attending: Family | Primary: Family

## 2016-02-26 DIAGNOSIS — M7989 Other specified soft tissue disorders: Secondary | ICD-10-CM

## 2016-02-26 MED ORDER — tiotropium-olodaterol (STIOLTO RESPIMAT) 2.5-2.5 mcg/actuation Mist
2.5-2.5 | Freq: Every day | RESPIRATORY_TRACT | 11 refills | Status: DC
Start: 2016-02-26 — End: 2016-03-18

## 2016-02-26 MED ORDER — ipratropium-albuterol (COMBIVENT RESPIMAT) 20-100 mcg/actuation inhaler
20-100 | Freq: Four times a day (QID) | RESPIRATORY_TRACT | 11 refills | 7.50000 days | Status: AC
Start: 2016-02-26 — End: ?

## 2016-02-26 NOTE — Telephone Encounter (Signed)
Please notify patient to call on-call provider for ultrasound results of left lower extremity if he has DVT he will need to be started on blood thinners. If this is not the case we will place a referral for ankle-brachial brachial index and evaluation for PVD lower extremities  Thank you

## 2016-02-26 NOTE — Telephone Encounter (Signed)
Patient called stating that his left leg has been swollen for 3 days it comes and goes  .    appointment made to see Provider 02/26/16

## 2016-02-26 NOTE — Telephone Encounter (Signed)
Patient is calling to schedule a appointment, Patient states his L Leg Is swollen and is really concerned due to having diabetes. Please advise at [phone] (home)   Will call nurse

## 2016-02-26 NOTE — Patient Instructions (Signed)
Tiotropium; Olodaterol respiratory inhalation spray  What is this medicine?  TIOTROPIUM; OLODATEROL (tee oh TRO pee um; OH loe DA ter ol) inhalation is a combination of two medicines that help to open up the airways of your lungs. It is for chronic obstructive pulmonary disease (COPD), including chronic bronchitis or emphysema. Do NOT use for asthma or an acute asthma attack. Do NOT use for a COPD attack.  This medicine may be used for other purposes; ask your health care provider or pharmacist if you have questions.  What should I tell my health care provider before I take this medicine?  They need to know if you have any of these conditions:  -asthma  -bladder problems or difficulty passing urine  -diabetes  -glaucoma  -heart disease or irregular heartbeat  -high blood pressure  -kidney disease  -pheochromocytoma  -prostate disease  -seizures  -thyroid disease  -an unusual or allergic reaction to tiotropium, olodaterol, ipratropium, atropine, other medicines, foods, dyes, or preservatives  -pregnant or trying to get pregnant  -breast-feeding  How should I use this medicine?  This medicine is inhaled through the mouth. It is used once per day. Follow the directions on the prescription label. Take your medicine at regular intervals. Do not take your medicine more often than directed. Do not stop taking except on your doctor's advice. Make sure that you are using your inhaler correctly. Ask you doctor or health care provider if you have any questions.  A special MedGuide will be given to you by the pharmacist with each prescription and refill. Be sure to read this information carefully each time.  Talk to your pediatrician regarding the use of this medicine in children. Special care may be needed.  Overdosage: If you think you have taken too much of this medicine contact a poison control center or emergency room at once.  NOTE: This medicine is only for you. Do not share this medicine with others.  What if I miss a  dose?  If you miss a dose, use it as soon as you can. If it is almost time for your next dose, use only that dose and continue with your regular schedule. Do not use double or extra doses.  What may interact with this medicine?  Do not take this medicine with any of the following medications:  -MAOIs like Carbex, Eldepryl, Marplan, Nardil, and Parnate  This medicine may also interact with the following medications:  -atropine  -certain medicines for allergy, cough and cold  -certain medicines for bladder problems like oxybutynin, tolterodine  -certain medicines for stomach problems like dicyclomine, hyoscyamine  -certain medicines for travel sickness like scopolamine  -certain medicines for Parkinson's disease like benztropine, trihexyphenidyl  -diuretics  -ipratropium  -medicines for depression, anxiety, or psychotic disturbances  -medicines for fungal infections like ketoconazole  -medicines for weight loss including some herbal products  -other medicines that prolong the QT interval (cause an abnormal heart rhythm)  -procarbazine  -ritonavir  -some antibiotics like clarithromycin, erythromycin, levofloxacin, linezolid, and telithromycin  -some heart medicines  -steroid medicines like prednisone or cortisone  -stimulant medicines for attention disorders, weight loss, or to stay awake  -theophylline  -thyroid hormones  This list may not describe all possible interactions. Give your health care provider a list of all the medicines, herbs, non-prescription drugs, or dietary supplements you use. Also tell them if you smoke, drink alcohol, or use illegal drugs. Some items may interact with your medicine.  What should I watch for while   using this medicine?  Visit your doctor or health care professional for regular checkups. Tell your doctor or health care professional if your symptoms do not get better.  If your symptoms get worse or if you need your short-acting inhalers more often, call your doctor right away. Do not  use this medicine more than once every 24 hours.  Do not get the this medicine in your eyes. It can cause irritation, pain, or blurred vision.  You may get dizzy. Do not drive, use machinery, or do anything that needs mental alertness until you know how this medicine affects you. Do not stand or sit up quickly, especially if you are an older patient. This reduces the risk of dizzy or fainting spells.  Clean the inhaler as directed in the patient information sheet that comes with this medicine.  What side effects may I notice from receiving this medicine?  Side effects that you should report to your doctor or health care professional as soon as possible:  -allergic reactions like skin rash or hives, swelling of the face, lips, or tongue  -breathing problems right after inhaling your medicine  -changes in vision  -chest pain  -difficulty breathing or wheezing that increases or does not go away  -fast, irregular heartbeat  -feeling faint or lightheaded, falls  -general ill feeling or flu-like symptoms  -stomach pain  -trouble passing urine or change in the amount of urine  -unusually weak or tired  Side effects that usually do not require medical attention (Report these to your doctor or health care professional if they continue or are bothersome.):  -back pain  -constipation  -cough  -dry mouth  -nervousness  -runny nose  -sore throat  -tremor  This list may not describe all possible side effects. Call your doctor for medical advice about side effects. You may report side effects to FDA at 1-800-FDA-1088.  Where should I keep my medicine?  Keep out of the reach of children.  Store at room temperature between 15 and 30 degrees C (59 and 86 degrees F). Do not freeze. The inhaler should be discarded after 3 months or when the inhaler becomes locked, whichever comes first. Throw away any unopened packages after the expiration date.  NOTE: This sheet is a summary. It may not cover all possible information. If you have  questions about this medicine, talk to your doctor, pharmacist, or health care provider.     © 2016, [employer]vier/Gold Standard. (2014-02-17 15:00:47)

## 2016-02-26 NOTE — Progress Notes (Signed)
Charles [name] is a 70 y.o. male.   Chief Complaint   Patient presents with   . Leg Swelling       HISTORY OF PRESENT ILLNESS   HPI  Cornelius is here today to evaluate his left lower extremity swelling. States he's had 3 days of swelling left lower extremity has some mild pain no erythema no loss of sensation no trauma does have a history of smoking peripheral vascular disease type 2 diabetes and hyperlipidemia.    Lipidemia  Currently taking Crestor 20 mg denies any muscle cramping    Hypertension  Currently well controlled he is taking home readings and has had no elevations in blood pressures. No chest pain he is on chronic Imdur    COPD  Long standing history of COPD currently taking Combivent and stiloto requesting samples of the latter today I will send a refill for Combivent to his pharmacy.    No problems updated.  Past Medical History:   Diagnosis Date   . Angina at rest (CMS/HCC)    . Arrhythmia     years ago, EPISODES of vtach whole life   . COPD (chronic obstructive pulmonary disease) (CMS/HCC)    . Coronary artery disease    . Diabetes mellitus (CMS/HCC) 2013   . Fatigue    . GERD (gastroesophageal reflux disease)     for years   . Hyperlipidemia    . Hypertension 2006   . Interstitial lung disease (CMS/HCC)    . Narcolepsy and cataplexy    . NSTEMI (non-ST elevated myocardial infarction) (CMS/HCC) 08/2006 with stent mid left circumflex   . Stroke (CMS/HCC) 1980's    had neuro changes but resolved     Past Surgical History:   Procedure Laterality Date   . CARDIAC CATHETERIZATION  2016   . CORONARY STENT PLACEMENT Left mid circumflex    08/2006   . eye surger      Tear duct surgery   . TONSILLECTOMY      as a child     Allergies   Allergen Reactions   . Gabapentin Other (See Comments)     Yellowing of skin.   . Iodine And Iodide Containing Products Anaphylaxis     CONTRAST DYE used years ago for heart cath     Social History     Social History Main Topics   . Smoking status: Current Every Day Smoker        Packs/day: 0.25     Years: 52.00   . Smokeless tobacco: Former Neurosurgeon      Comment: Dr. Alfred Levins discussed smoking cessation with visit.   Marland Kitchen Alcohol use No   . Drug use: No   . Sexual activity: Yes     Partners: Female     Social History   . Marital status: Significant Other     Spouse name: N/A   . Number of children: 3   . Years of education: N/A     Other Topics Concern   . Exercise Yes     a little     Family History   Problem Relation Age of Onset   . Heart attack Mother    . Heart disease Mother    . Stroke Mother    . Heart failure Mother    . Heart disease Father    . Heart failure Father      No visits with results within 3 Month(s) from this visit.  Latest known visit with results is:  Admission on 09/23/2015, Discharged on 09/26/2015   Component Date Value Ref Range Status   . WBC 09/23/2015 10.7  4.8 - 10.8 10*3/?L Final   . RBC 09/23/2015 5.26  4.70 - 6.10 10*6/?L Final   . Hemoglobin 09/23/2015 15.4  12.0 - 18.0 g/dL Final   . HCT 16/06/9603 46.1  42.0 - 54.0 % Final   . MCV 09/23/2015 87.5  81.0 - 99.0 fL Final   . MCH 09/23/2015 29.3  27.0 - 34.0 pg Final   . MCHC 09/23/2015 33.5  32.0 - 36.0 g/dL Final   . RDW 54/05/8118 13.3  11.5 - 14.5 % Final   . Platelet Count 09/23/2015 183  150 - 400 10*3/?L Final   . MPV 09/23/2015 8.7  7.4 - 10.4 fL Final   . Neutrophils % 09/23/2015 45  35 - 70 % Final   . Lymphocytes % 09/23/2015 46* 25 - 45 % Final   . Monocytes % 09/23/2015 8  0 - 12 % Final   . Eosinophils % 09/23/2015 0  0 - 7 % Final   . Basophils % 09/23/2015 1  0 - 2 % Final   . Neutrophils, Absolute 09/23/2015 4.8  1.6 - 7.3 10*3/?L Final   . Lymphocytes, Absolute 09/23/2015 4.9* 1.1 - 4.3 10*3/?L Final   . Monocytes, Absolute 09/23/2015 0.8  0.0 - 1.2 10*3/?L Final   . Eosinophils, Absolute 09/23/2015 0.0  0.0 - 0.7 10*3/?L Final   . Basophils, Absolute 09/23/2015 0.1  0.0 - 0.2 10*3/?L Final   . Differential Type 09/23/2015 Automated Differential   Final   . Sodium 09/23/2015 135  135 - 143  mmol/L Final   . Potassium 09/23/2015 3.9  3.5 - 5.1 mmol/L Final   . Chloride 09/23/2015 102  98 - 111 mmol/L Final   . CO2 - Carbon Dioxide 09/23/2015 23.1  21.0 - 31.0 mmol/L Final   . Glucose 09/23/2015 186* 80 - 99 mg/dL Final   . BUN 14/78/2956 17  6 - 23 mg/dL Final   . Creatinine 21/30/8657 1.01  0.65 - 1.30 mg/dL Final   . Calcium 84/69/6295 9.6  8.6 - 10.3 mg/dL Final   . AST - Aspartate Aminotransferase 09/23/2015 27  10 - 50 IU/L Final   . ALT - Alanine Amino transferase 09/23/2015 50  7 - 52 IU/L Final   . Alkaline Phosphatase 09/23/2015 55  34 - 104 IU/L Final   . Bilirubin Total 09/23/2015 0.4  0.3 - 1.2 mg/dL Final   . Protein Total 09/23/2015 6.5  6.0 - 8.0 g/dL Final   . Albumin 28/41/3244 4.2  3.5 - 5.0 g/dL Final   . Globulin 09/21/7251 2.3  2.2 - 3.7 g/dL Final   . Albumin/Globulin Ratio 09/23/2015 1.8  >0.9 Final   . Anion Gap 09/23/2015 9.9  3.0 - 11.0 mmol/L Final   . GFR Estimate 09/23/2015 >60  >=60 mL/min/1.68m*2 Final   . GFR Additional Info 09/23/2015    Final   . Lactic Acid ED 09/23/2015 1.4  0.5 - 2.2 mmol/L Final   . Blood Culture Result 09/23/2015 No Growth   Final   . Blood Culture Result 09/23/2015 No Growth   Final   . Influenza A 09/23/2015 Influenza A Viral RNA Not Detected  Influenza A Viral RNA Not Detected Final   . Influenza B 09/23/2015 Influenza B Viral RNA Not Detected  Influenza B Viral RNA Not Detected Final   . D Dimer, Quantitative 09/23/2015 507* 100 -  500 ng/mL FEU Final   . WBC 09/24/2015 6.3  4.8 - 10.8 10*3/?L Final   . RBC 09/24/2015 4.79  4.70 - 6.10 10*6/?L Final   . Hemoglobin 09/24/2015 14.5  12.0 - 18.0 g/dL Final   . HCT 16/06/9603 41.8* 42.0 - 54.0 % Final   . MCV 09/24/2015 87.3  81.0 - 99.0 fL Final   . MCH 09/24/2015 30.2  27.0 - 34.0 pg Final   . MCHC 09/24/2015 34.6  32.0 - 36.0 g/dL Final   . RDW 54/05/8118 13.1  11.5 - 14.5 % Final   . Platelet Count 09/24/2015 164  150 - 400 10*3/?L Final   . MPV 09/24/2015 8.8  7.4 - 10.4 fL Final   .  Neutrophils % 09/24/2015 66  35 - 70 % Final   . Lymphocytes % 09/24/2015 32  25 - 45 % Final   . Monocytes % 09/24/2015 2  0 - 12 % Final   . Eosinophils % 09/24/2015 0  0 - 7 % Final   . Basophils % 09/24/2015 0  0 - 2 % Final   . Neutrophils, Absolute 09/24/2015 4.1  1.6 - 7.3 10*3/?L Final   . Lymphocytes, Absolute 09/24/2015 2.0  1.1 - 4.3 10*3/?L Final   . Monocytes, Absolute 09/24/2015 0.2  0.0 - 1.2 10*3/?L Final   . Eosinophils, Absolute 09/24/2015 0.0  0.0 - 0.7 10*3/?L Final   . Basophils, Absolute 09/24/2015 0.0  0.0 - 0.2 10*3/?L Final   . Differential Type 09/24/2015 Automated Differential   Final   . Sodium 09/24/2015 138  135 - 143 mmol/L Final   . Potassium 09/24/2015 4.2  3.5 - 5.1 mmol/L Final   . Chloride 09/24/2015 108  98 - 111 mmol/L Final   . CO2 - Carbon Dioxide 09/24/2015 22.4  21.0 - 31.0 mmol/L Final   . Glucose 09/24/2015 169* 80 - 99 mg/dL Final   . BUN 14/78/2956 13  6 - 23 mg/dL Final   . Creatinine 21/30/8657 0.71  0.65 - 1.30 mg/dL Final   . Calcium 84/69/6295 8.4* 8.6 - 10.3 mg/dL Final   . Anion Gap 28/41/3244 7.6  3.0 - 11.0 mmol/L Final   . GFR Estimate 09/24/2015 >60  >=60 mL/min/1.68m*2 Final   . GFR Additional Info 09/24/2015    Final   . POC Glucose 09/24/2015 171* 80 - 99 mg/dL Final   . POC Glucose 09/21/7251 164* 80 - 99 mg/dL Final   . POC Glucose 66/44/0347 193* 80 - 99 mg/dL Final   . POC Glucose 42/59/5638 253* 80 - 99 mg/dL Final   . POC Glucose 75/64/3329 130* 80 - 99 mg/dL Final   . POC Glucose 51/88/4166 170* 80 - 99 mg/dL Final   . POC Glucose 03/18/1600 212* 80 - 99 mg/dL Final   . POC Glucose 09/32/3557 207* 80 - 99 mg/dL Final   . Sodium 32/20/2542 141  135 - 143 mmol/L Final   . Potassium 09/26/2015 3.6  3.5 - 5.1 mmol/L Final   . Chloride 09/26/2015 107  98 - 111 mmol/L Final   . CO2 - Carbon Dioxide 09/26/2015 26.5  21.0 - 31.0 mmol/L Final   . Glucose 09/26/2015 138* 80 - 99 mg/dL Final   . BUN 70/62/3762 12  6 - 23 mg/dL Final   . Creatinine 83/15/1761  0.80  0.65 - 1.30 mg/dL Final   . Calcium 60/73/7106 8.4* 8.6 - 10.3 mg/dL Final   . Anion Gap 26/94/8546 7.5  3.0 - 11.0 mmol/L Final   . GFR Estimate 09/26/2015 >60  >=60 mL/min/1.16m*2 Final   . GFR Additional Info 09/26/2015    Final   . WBC 09/26/2015 10.2  4.8 - 10.8 10*3/?L Final   . RBC 09/26/2015 4.79  4.70 - 6.10 10*6/?L Final   . Hemoglobin 09/26/2015 14.0  12.0 - 18.0 g/dL Final   . HCT 95/62/1308 42.0  42.0 - 54.0 % Final   . MCV 09/26/2015 87.7  81.0 - 99.0 fL Final   . MCH 09/26/2015 29.3  27.0 - 34.0 pg Final   . MCHC 09/26/2015 33.4  32.0 - 36.0 g/dL Final   . RDW 65/78/4696 13.0  11.5 - 14.5 % Final   . Platelet Count 09/26/2015 177  150 - 400 10*3/?L Final   . MPV 09/26/2015 8.9  7.4 - 10.4 fL Final   . Neutrophils % 09/26/2015 72* 35 - 70 % Final   . Bands % 09/26/2015 1  0 - 10 % Final   . Lymphocytes % 09/26/2015 20* 25 - 45 % Final   . Monocytes % 09/26/2015 7  0 - 12 % Final   . Eosinophils % 09/26/2015 0  0 - 7 % Final   . Basophils % 09/26/2015 0  0 - 2 % Final   . Neutrophils, Absolute 09/26/2015 7.4* 1.6 - 7.3 10*3/?L Final   . Bands, Absolute 09/26/2015 0.1  0.0 - 1.2 10*3/?L Final   . Lymphocytes, Absolute 09/26/2015 2.0  1.1 - 4.3 10*3/?L Final   . Monocytes, Absolute 09/26/2015 0.7  0.0 - 1.2 10*3/?L Final   . Eosinophils, Absolute 09/26/2015 0.0  0.0 - 0.7 10*3/?L Final   . Basophils, Absolute 09/26/2015 0.0  0.0 - 0.2 10*3/?L Final   . Differential Type 09/26/2015 Manual Differential   Final   . Platelet Estimate 09/26/2015 Normal  Normal Final   . RBC Morphology 09/26/2015 Normal   Final   . POC Glucose 09/26/2015 121* 80 - 99 mg/dL Final   . POC Glucose 29/52/8413 150* 80 - 99 mg/dL Final       REVIEW OF SYSTEMS   Review of Systems   Constitutional: Negative for chills and fatigue.   Respiratory: Negative for cough, shortness of breath and wheezing.    Cardiovascular: Negative for chest pain, palpitations and leg swelling.   Musculoskeletal:        Left calf pain   Skin: Negative  for rash and wound.   Neurological: Negative for weakness and headaches.   Psychiatric/Behavioral: Negative for sleep disturbance.       PHYSICAL EXAM   BP 116/80  Pulse 81  Temp 36.5 ?C (97.7 ?F) (Oral)   Ht 5' 10 (1.778 m)  Wt 199 lb 8 oz (90.5 kg)  SpO2 96%  BMI 28.63 kg/m2  Body mass index is 28.63 kg/(m^2).  Physical Exam   Constitutional: He is oriented to person, place, and time. He appears well-developed and well-nourished. No distress.   HENT:   Head: Normocephalic and atraumatic.   Eyes: Conjunctivae are normal. Pupils are equal, round, and reactive to light.   Cardiovascular: Normal rate, regular rhythm and normal heart sounds.    Pulmonary/Chest: Effort normal and breath sounds normal. He has no wheezes.   Musculoskeletal:        Legs:  Spelling on left lower extremity from upper shin to top of foot 1+ CSM is intact pulses are palpable 1+ bilaterally no erythema noted no cords palpated Homans sign is  negative   Neurological: He is alert and oriented to person, place, and time.   Skin: Skin is warm and dry. He is not diaphoretic.   Psychiatric: He has a normal mood and affect. His behavior is normal. Judgment and thought content normal.   Nursing note and vitals reviewed.        ASSESSMENT & PLAN     Problem List Items Addressed This Visit        Respiratory    COPD with exacerbation (CMS/HCC)    Relevant Medications    tiotropium-olodaterol (STIOLTO RESPIMAT) 2.5-2.5 mcg/actuation Mist    ipratropium-albuterol (COMBIVENT RESPIMAT) 20-100 mcg/actuation inhaler  Given samples of stiolto. Advised on proper usage of this medication       Other Visit Diagnoses     Swelling of left lower extremity    -  Primary    Relevant Orders    Ultrasound extremity left venous duplex  History of physical exam he is sent for ultrasound of lower extremity to rule out DVT. This does not look like classic DVT however there may be some occlusion. Consider decreased blood flow and possible occlusion in the femoral given  his history of hyperlipidemia smoking and hypertension he may need to have ankle brachial index. If ultrasound is negative we will send for ankle brachial index testing and possible evaluation for femoral popliteal bypass or further workup.      Hyperlipidemia  Denies myalgias compliant with medication this is stable. Continue treatment plan.    Hypertension  Tinea antihypertensive therapy.      Current Outpatient Prescriptions:   .  amLODIPine (NORVASC) 10 MG tablet, Take 10 mg by mouth daily., Disp: , Rfl:   .  aspirin 81 MG EC tablet, Take 81 mg by mouth daily., Disp: , Rfl:   .  doxazosin (CARDURA) 4 MG tablet, Take 1 tablet (4 mg total) by mouth nightly., Disp: 90 tablet, Rfl: 3  .  finasteride (PROSCAR) 5 mg tablet, Take 1 tablet (5 mg total) by mouth daily., Disp: 90 tablet, Rfl: 3  .  isosorbide mononitrate (IMDUR) 30 MG 24 hr tablet, Take 1 tablet (30 mg total) by mouth daily., Disp: 30 tablet, Rfl: 11  .  losartan (COZAAR) 100 MG tablet, Take 1 tablet (100 mg total) by mouth daily., Disp: 30 tablet, Rfl: 11  .  metFORMIN (GLUCOPHAGE) 1000 MG tablet, Take 1 tablet (1,000 mg total) by mouth 2 (two) times daily with meals., Disp: 60 tablet, Rfl: 11  .  metoprolol tartrate (LOPRESSOR) 50 MG tablet, Take 1 tablet (50 mg total) by mouth 2 (two) times daily., Disp: 60 tablet, Rfl: 11  .  pantoprazole (PROTONIX) 40 MG tablet, Take 1 tablet (40 mg total) by mouth daily., Disp: 90 tablet, Rfl: 3  .  rosuvastatin (CRESTOR) 20 MG tablet, Take 1 tablet (20 mg total) by mouth nightly., Disp: 90 tablet, Rfl: 3  .  tiotropium-olodaterol (STIOLTO RESPIMAT) 2.5-2.5 mcg/actuation Mist, Inhale 2 puffs into the lungs daily., Disp: 4 g, Rfl: 11  .  ipratropium-albuterol (COMBIVENT RESPIMAT) 20-100 mcg/actuation inhaler, Inhale 1 puff into the lungs 4 times daily., Disp: 2 Container, Rfl: 11

## 2016-02-26 NOTE — Telephone Encounter (Signed)
Patient would benefit from more intensive diuretic

## 2016-02-29 NOTE — Telephone Encounter (Signed)
Charles [name] would like the results of their most recent diagnostic testing.    Patient call back number is: [phone] (home)     The best time to reach the patient is: Anytime     Can a detailed message be left? yes

## 2016-03-01 NOTE — Telephone Encounter (Signed)
Attempted to call [facility] regarding further treatment for LEE and he stated he did not wish to talk to [place].

## 2016-03-01 NOTE — Telephone Encounter (Signed)
Talked with Charles [name].  He wanted to let us know that he called yesterday to talk about the swelling in his leg.  He did know that the [place] was negative.  He wanted to know what the nest step would be if the leg was still swollen.  He also stated that the person taking the message did not tell him we out of the clinic .  He carried his phone waiting for a call.

## 2016-03-01 NOTE — Telephone Encounter (Signed)
Spoke with patient and the patient declined an Charles [name].    FYI- patient was upset about swelling in his leg and it not getting better, Before ending Call with Patient he stated  You people Can Pryor Montes My A** and ended call.

## 2016-03-01 NOTE — Telephone Encounter (Signed)
What on call provider does he call?

## 2016-03-02 NOTE — Telephone Encounter (Signed)
Received email :     Had a call from Ron Corsetti (sp?) who states he is a patient of Dr. Tawana Scale and that he is very disgusted and unhappy with something.  He would like a call at [phone].         Attempted to call patient, but there was no answer. Left message for patient to call back.

## 2016-03-03 NOTE — Telephone Encounter (Signed)
Received a call from [facility] ultrasound tech informing me that ultrasound studies were negative for DVT. She requested permission to inform the patient that testing was negative I discussed with her this was okay to do and please notify patient as we were on upcoming weekend so that he will not have to call on-call provider. She verbally inform the patient while on the phone with me.

## 2016-03-07 NOTE — Telephone Encounter (Signed)
Attempted to contact patient again, but still no answer. Left another message for patient to call back to find out what I could help him with.

## 2016-03-09 NOTE — Telephone Encounter (Signed)
Received request from [place] concerning Charles [name] medications. current /new med's - Stiolto and [employer].. Both med's. Have a muscarinic medication.  [facility] only allow the use of one Muscarinic agent.       Requested Change  If you want to use an albuterol inhaler but not the Comivent  IF so  Need a new prescription.

## 2016-03-09 NOTE — Telephone Encounter (Signed)
STIOLTO is the gold standard medication I would prefer him use this medication for best results. Maybe we can get clarification as to what his problem was with his treatment last time he was seen so we can better his care?

## 2016-03-09 NOTE — Telephone Encounter (Signed)
Medication Being Requested:    tiotropium-olodaterol (STIOLTO RESPIMAT) 2.5-2.5 mcg/actuation Mist and albuterol inhaler    Send To Pharmacy, or Office Pick Up? Send to the [facility]     [facility]  [address] [place]  Phone: [phone] Fax: [phone]    How many Days Remaining of medication: hardly any    Additional Information:     Patient is having difficulty getting these prescriptions filled by the [facility], Any questions please call Dael at [phone], okay to leave a detailed message     **Informed patient that it may take up to 48-72 hours to fill prescription.**    Future Appointments:  No future appointments.

## 2016-03-10 NOTE — Telephone Encounter (Signed)
Patient is not returning our calls and looking back on the other messages I do not believe he will be calling us back. I tried one more time with no success, left another voicemail. Will close encounter for now.

## 2016-03-18 MED ORDER — albuterol (PROVENTIL HFA;VENTOLIN HFA;PROAIR HFA) 90 mcg/actuation inhaler
90 | RESPIRATORY_TRACT | 5 refills | Status: AC | PRN
Start: 2016-03-18 — End: 2016-04-17

## 2016-03-18 MED ORDER — tiotropium-olodaterol (STIOLTO RESPIMAT) 2.5-2.5 mcg/actuation Mist
2.5-2.5 | Freq: Every day | RESPIRATORY_TRACT | 11 refills | Status: AC
Start: 2016-03-18 — End: ?

## 2016-03-18 NOTE — Telephone Encounter (Signed)
Faxed Rxs to [place] .

## 2016-03-18 NOTE — Telephone Encounter (Signed)
Tramel called to get this request clarified. [facility] and was told that they would fill the Stialto Respimat AND a plain-old Albuterol inhaler for him but will not prescribe the Stialto with a Combivent due to drug interactions.     Please manually print out and fax new prescriptions for these medications to the [place] pharmacy at:  [facility] [phone] (Phone)  [phone] (Fax)     The Delaney Meigs is a once-daily inhaler and he would like the Albuterol as a rescue inhaler.     Please call Klark at [phone] when these Rx's have been manually faxed to the [place] and he will be able to call them and verify this.  We can leave this info as a message on his phone if we miss him.    Message sent high priority due to him having zero rescue inhaler and only a sample of Stialto on hand.

## 2016-04-12 NOTE — Telephone Encounter (Signed)
Pt was seeing Josefine Class and was requesting to see Maurice Small; request was denied. Sent denial letter with a blank to blank ROI to pt. Pt checked NO in box 4 of the form. Please send confirmation letter. Thanks.

## 2017-09-28 IMAGING — US US ABDOMEN COMPLETE
1 series · 14 of 25 positions shown · non-contrast
Comparison: None.

HISTORY: Abdominal pain with distention.
TECHNIQUE: Scans of the abdomen are performed using real-time, duplex and color Doppler ultrasound.

[Series 1: us abdomen complete · 14 of 55 slices shown]
[im 1/55]
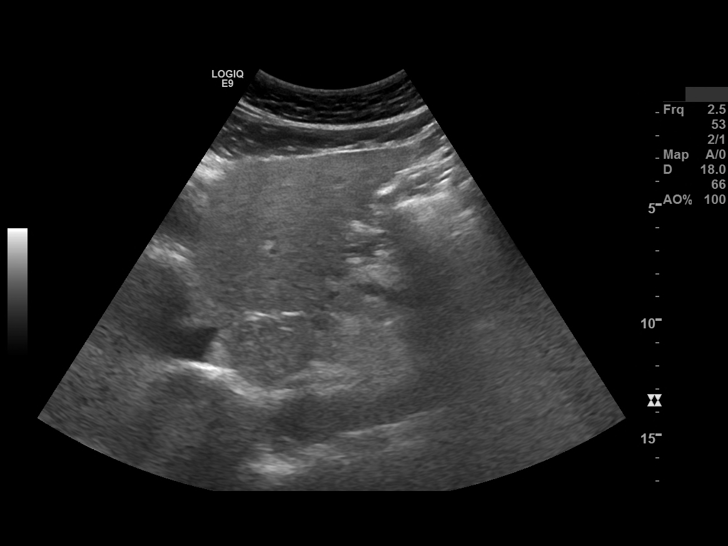
[im 5/55]
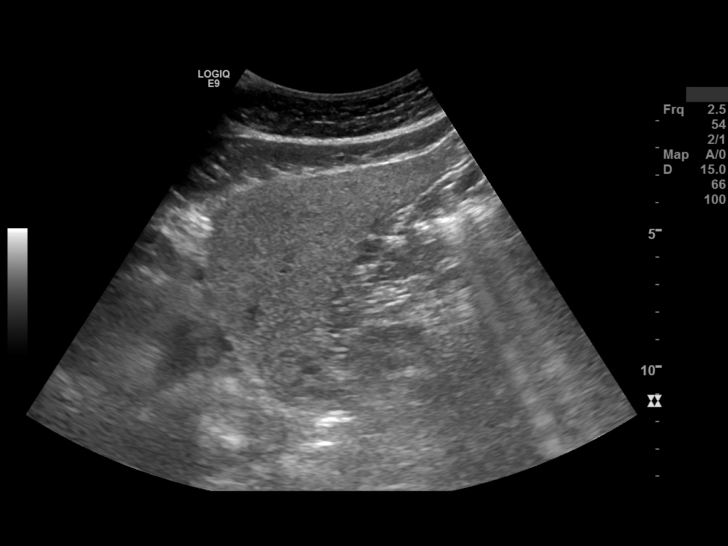
[im 10/55]
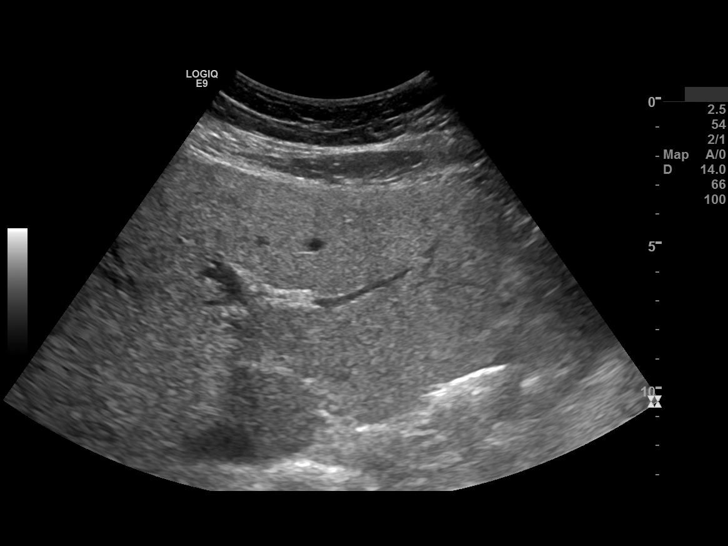
[im 14/55]
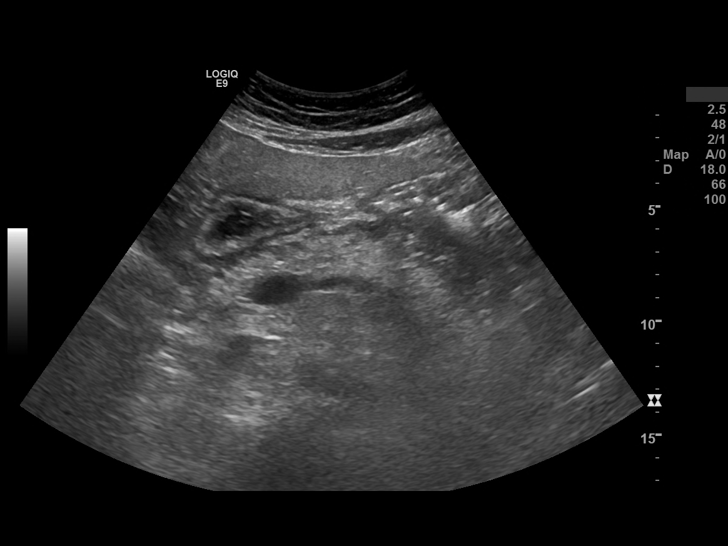
[im 19/55]
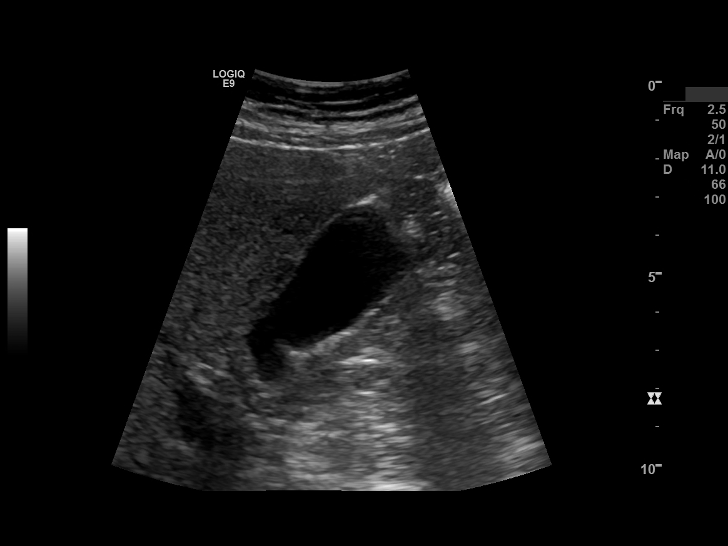
[im 21/55]
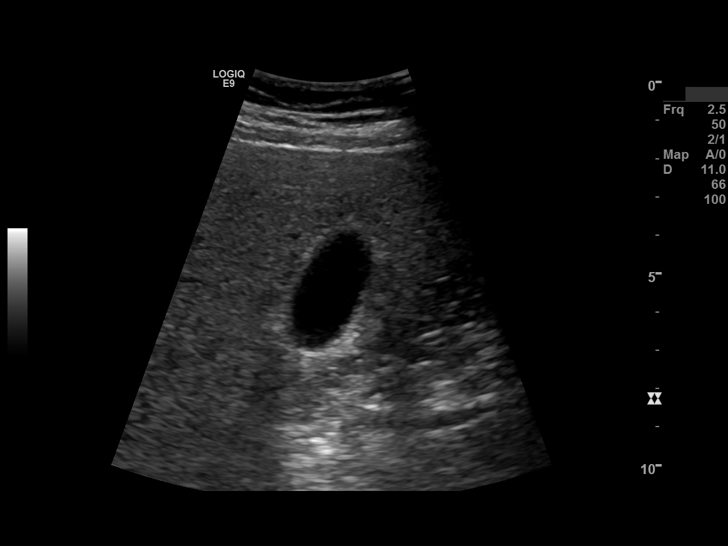
[im 25/55]
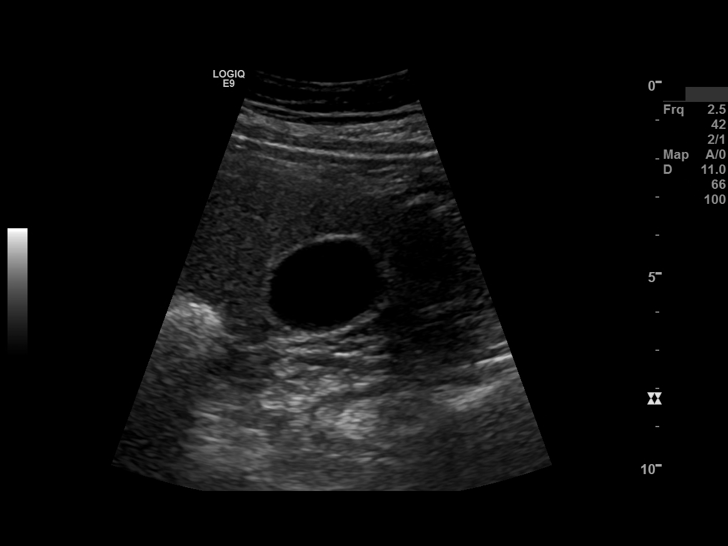
[im 30/55]
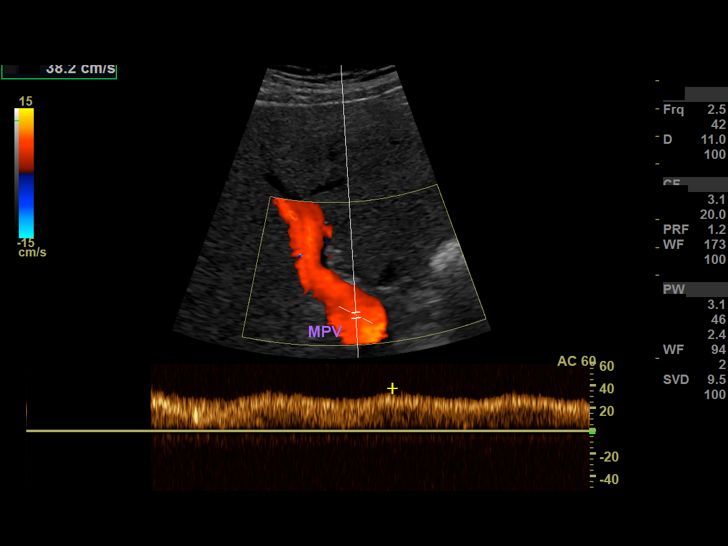
[im 34/55]
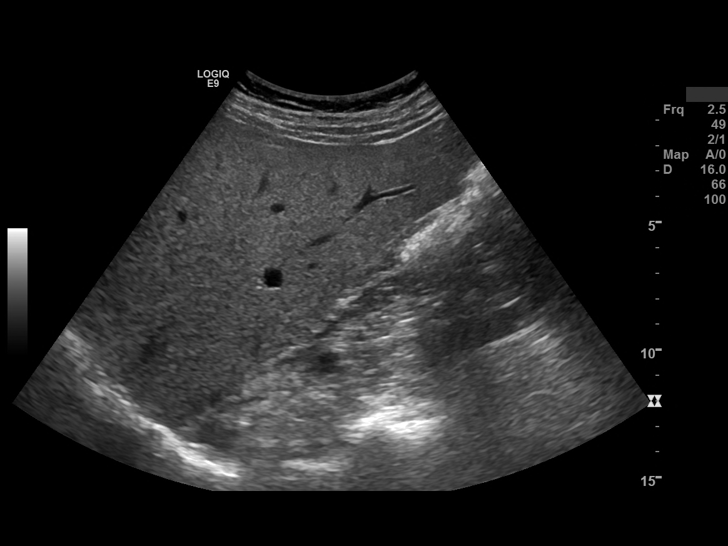
[im 37/55]
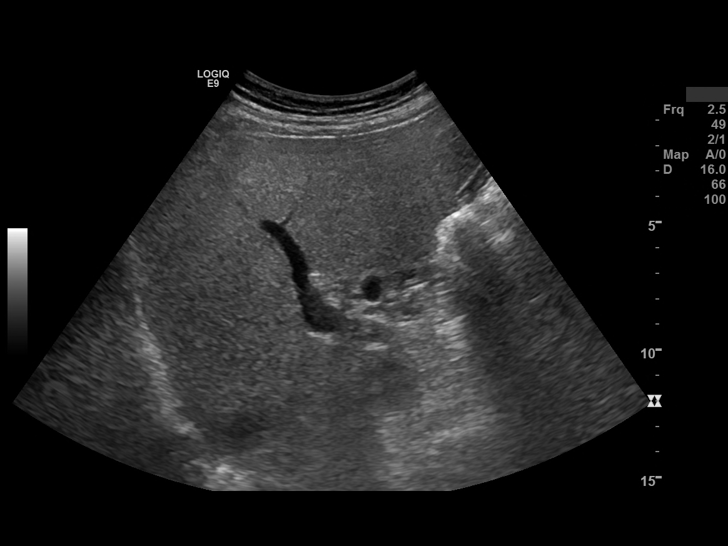
[im 41/55]
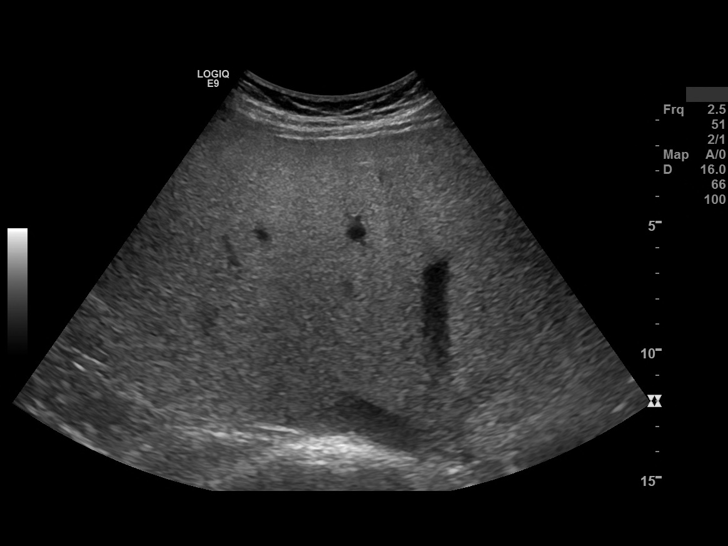
[im 46/55]
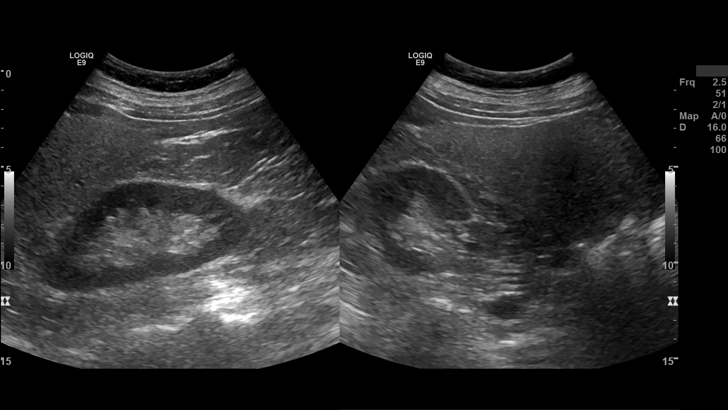
[im 50/55]
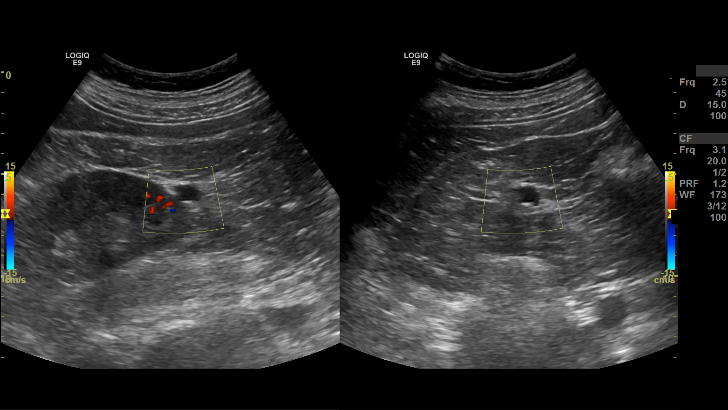
[im 55/55]
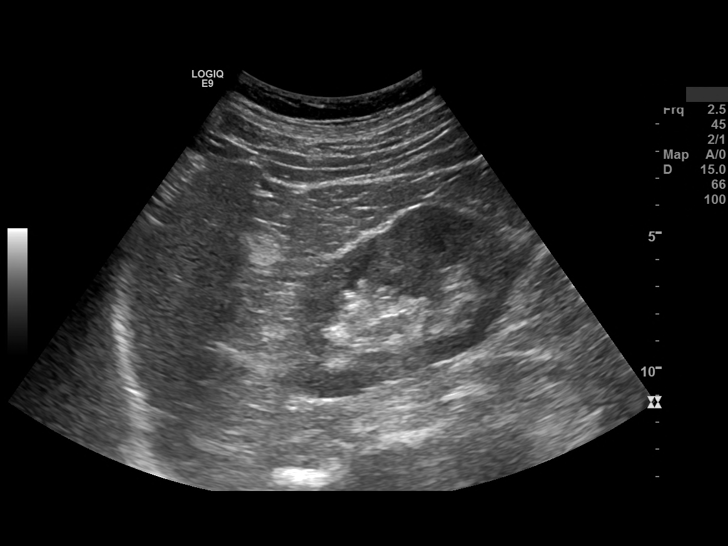

[14 of 25 positions shown; findings below may reference images not displayed]

FINDINGS: The liver is mildly enlarged measuring 17.2 cm in diameter, with significant increased liver echogenicity consistent with fatty infiltration of liver. No cirrhotic change or liver mass is present. The portal vein is patent and shows normal flow direction and diameter. The abdominal aorta, inferior vena cava and pancreas are normal.

Gallbladder wall is normal measuring 2 mm, with no stones, sludge, polyps or abnormal tenderness over the gallbladder fossa. Common bile duct is nondilated measuring 3 mm. Spleen is normal measuring 1 14 x 42 x 44 mm. The right and left kidney measure 115 x 54 x 54 mm and 120 x 57 x 57 mm with simple cyst in the lower pole of the right kidney measuring 13 x 9 x 10 mm. No free intraperitoneal fluid collections are present.
IMPRESSION: 1. Mild hepatomegaly with diffuse fatty infiltration of liver.

2. Simple right renal cyst.

## 2018-11-03 IMAGING — CR CHEST 2 VWS PA LAT
1 series · 2 of 2 positions shown · non-contrast
Comparison: None

HISTORY/INDICATIONS:  Congestive heart failure
TECHNIQUE: Chest 2 views.

[Series 1: pa · 0.17mm/px · 2 of 2 slices shown]
[im 1/2]
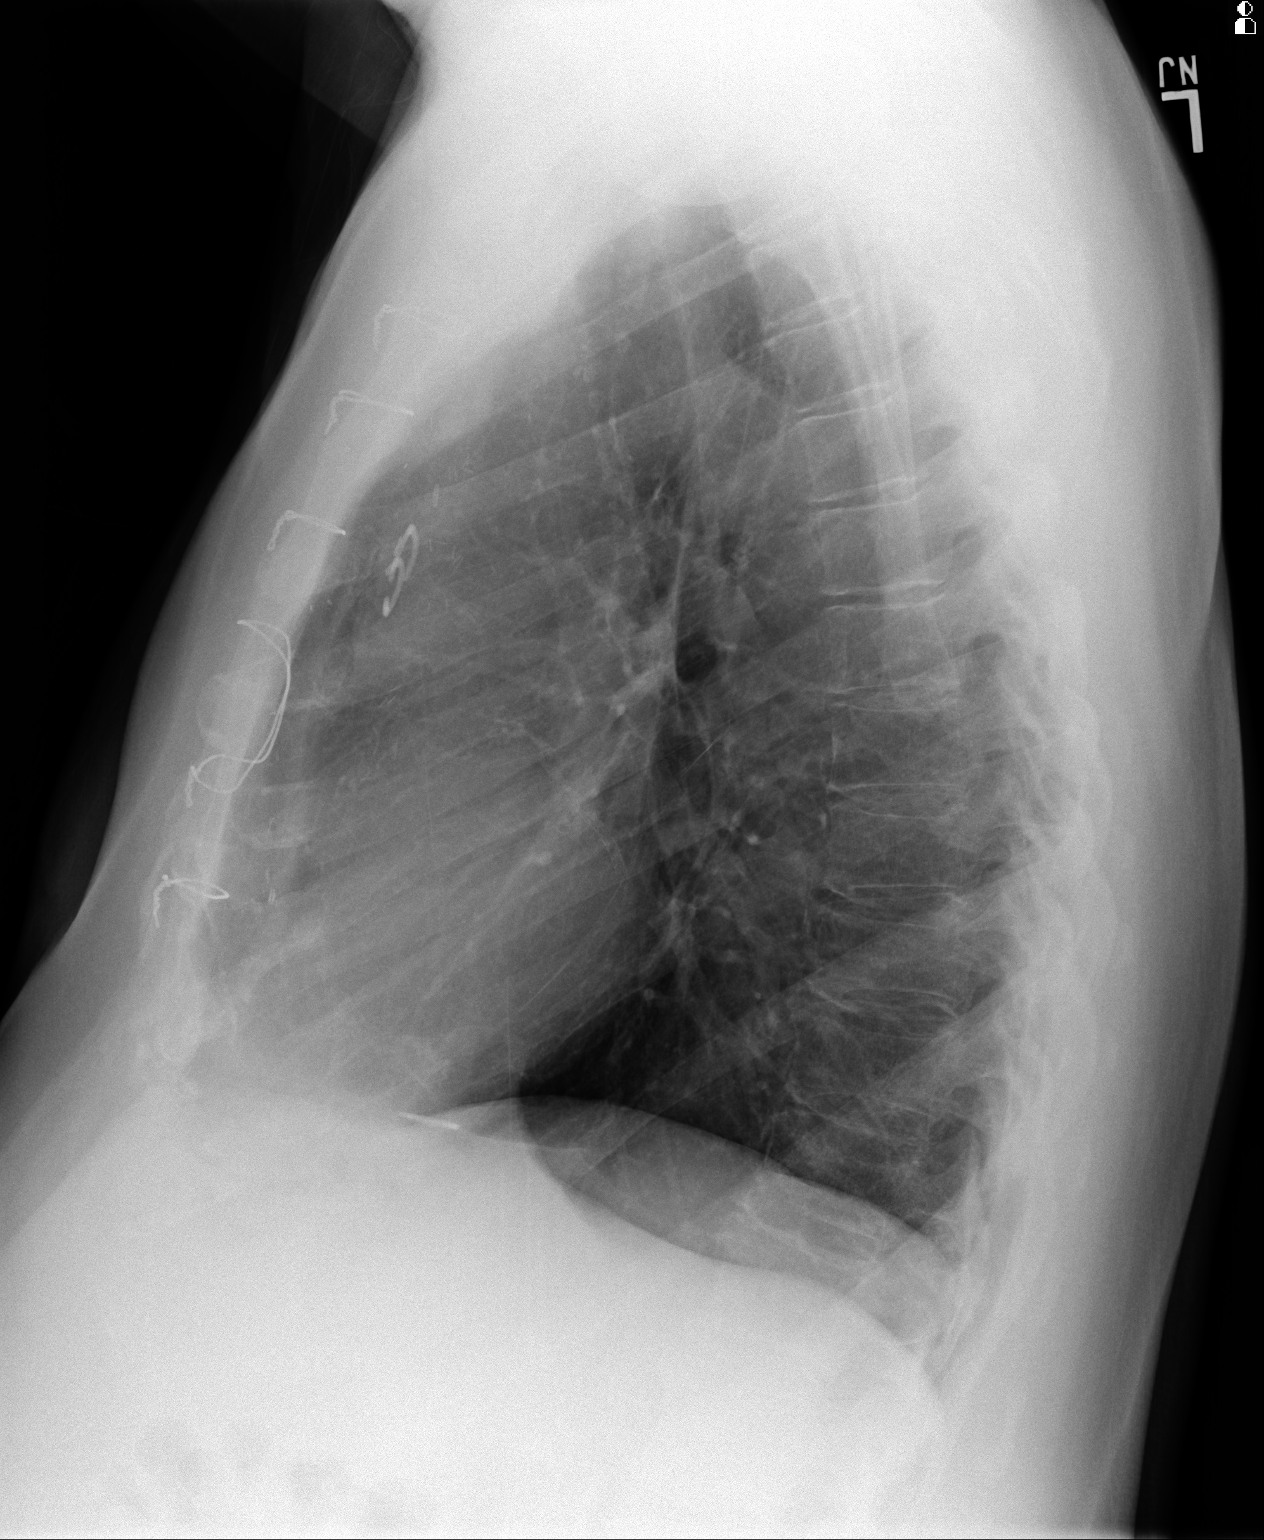
[im 2/2]
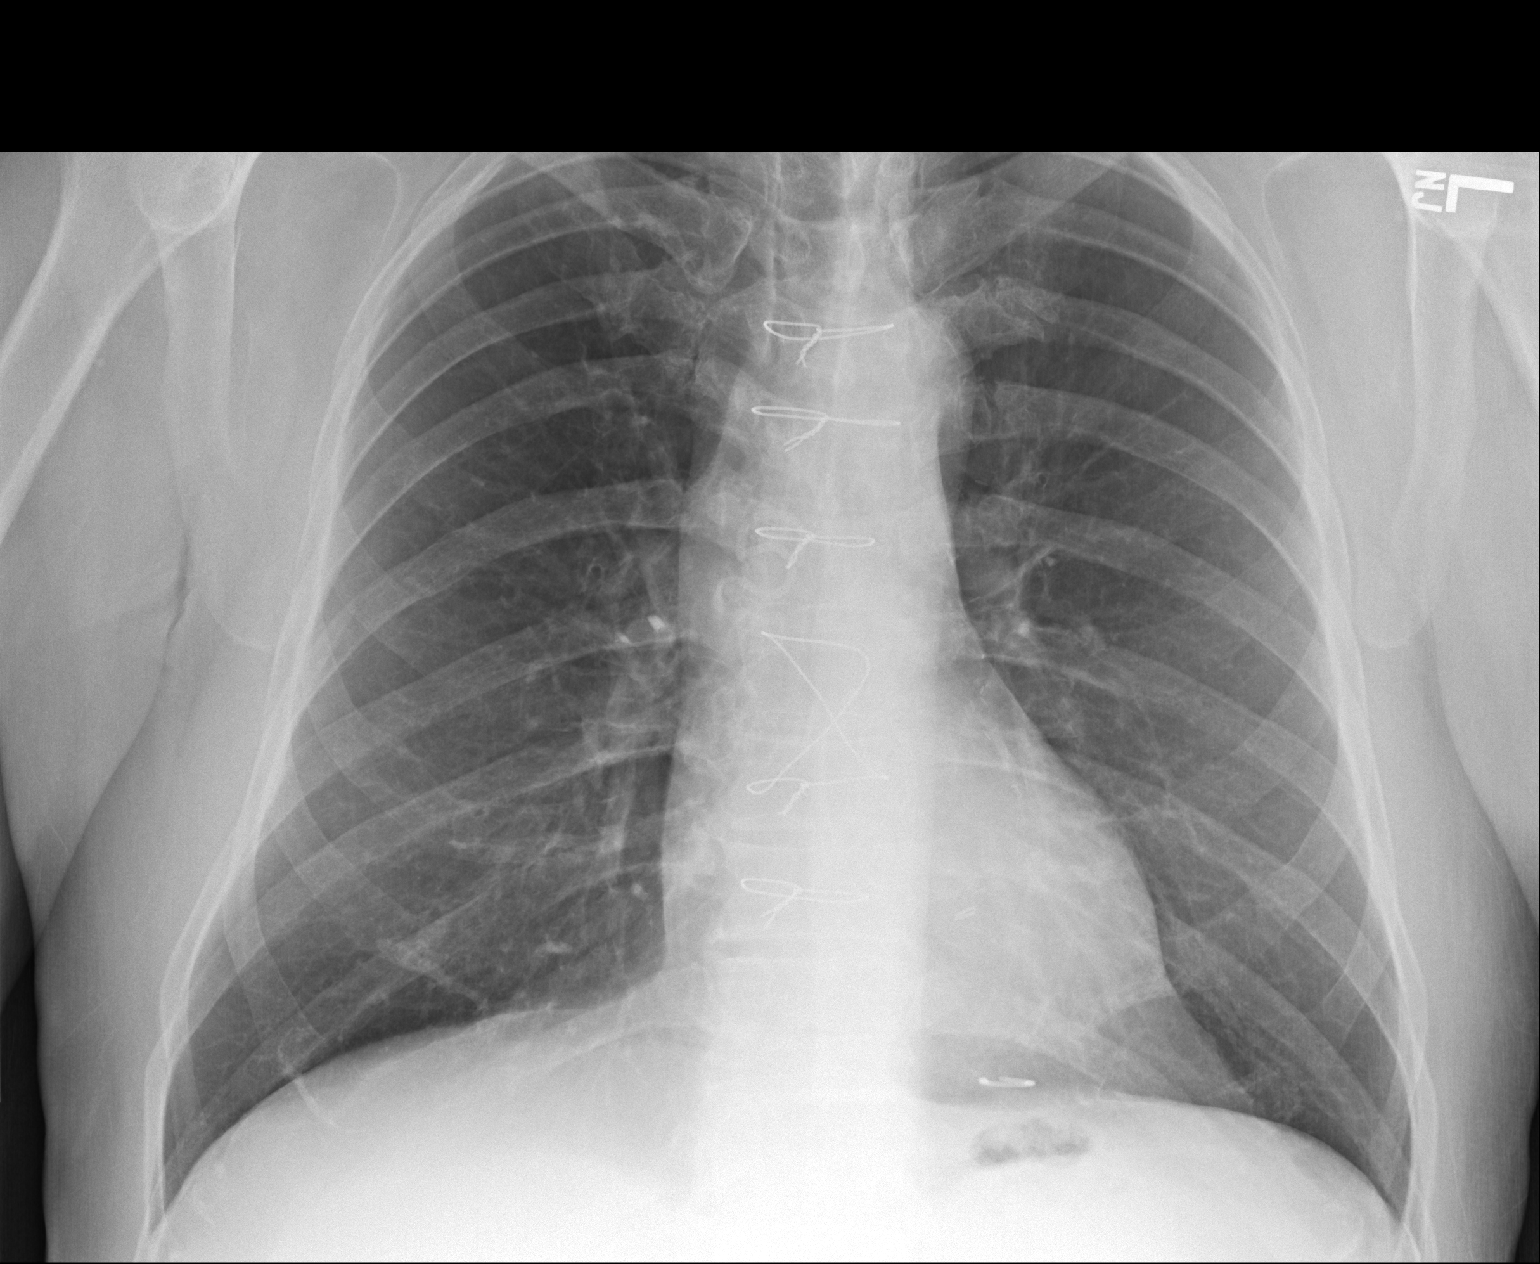

[2 of 2 positions shown; findings below may reference images not displayed]

FINDINGS: The cardiomediastinal silhouette is normal in size. The lungs are clear without focal consolidation, pleural effusion or pneumothorax. Evidence of prior median sternotomy. Mild calcific atherosclerosis of the aortic arch. A crescentic radiopaque structure is present along the left hemidiaphragm, likely postsurgical. No acute osseous abnormality is identified.
IMPRESSION: 1.
No radiographic evidence of an acute cardiopulmonary abnormality.

2.
Evidence of prior median sternotomy.

## 2019-09-05 IMAGING — RF ESOPHAGRAM W/AIR
6 series · 8 of 8 positions shown · non-contrast
Comparison: None

HISTORY/INDICATIONS:  Gastroesophageal reflux disease with hiatal hernia
TECHNIQUE: After oral administration of barium and air, barium swallow with esophagram is obtained. Fluoroscopy time is 1.4 minutes. 12 fluoroscopic images are obtained.

[Series 1: fluoro_barium 2fps_(person_name) · 0.17mm/px · 3 of 7 frames shown (1 of 6)]
[frame 2/7]
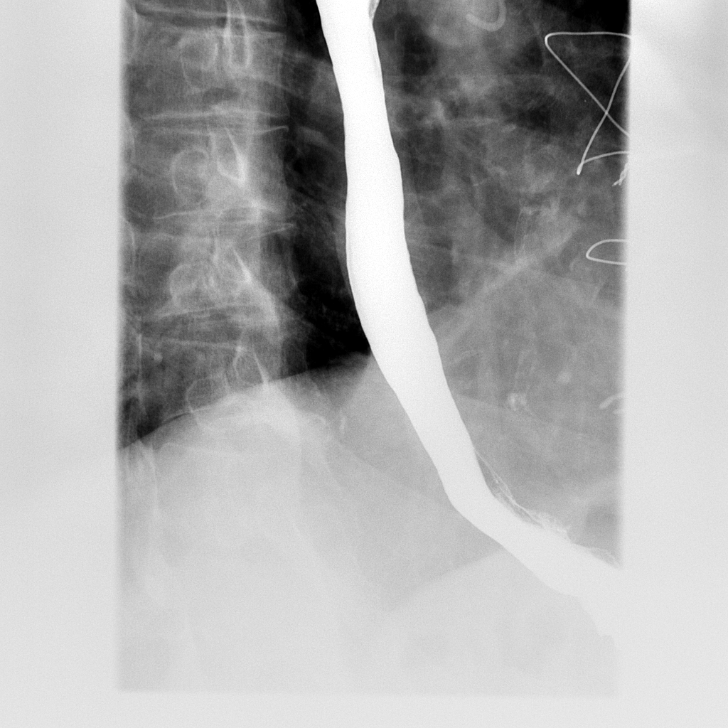
[frame 4/7]
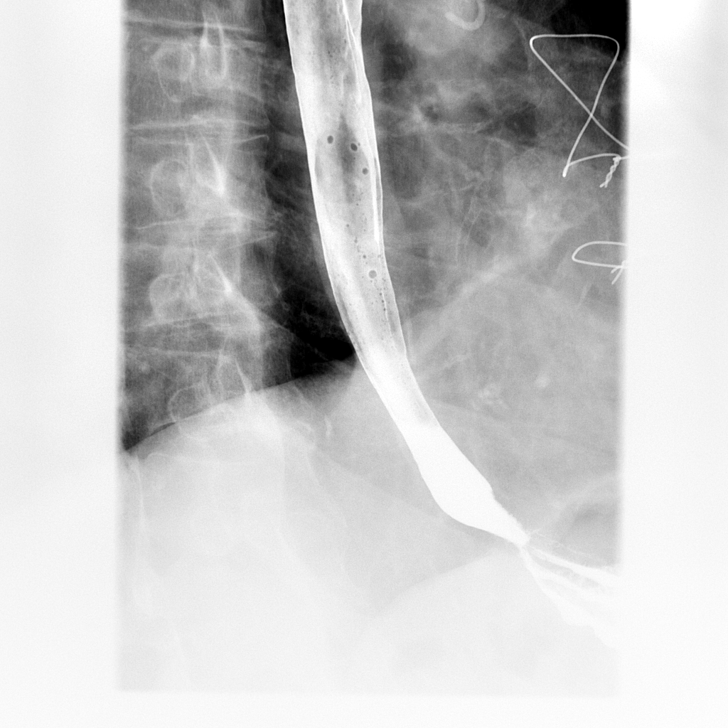
[frame 6/7]
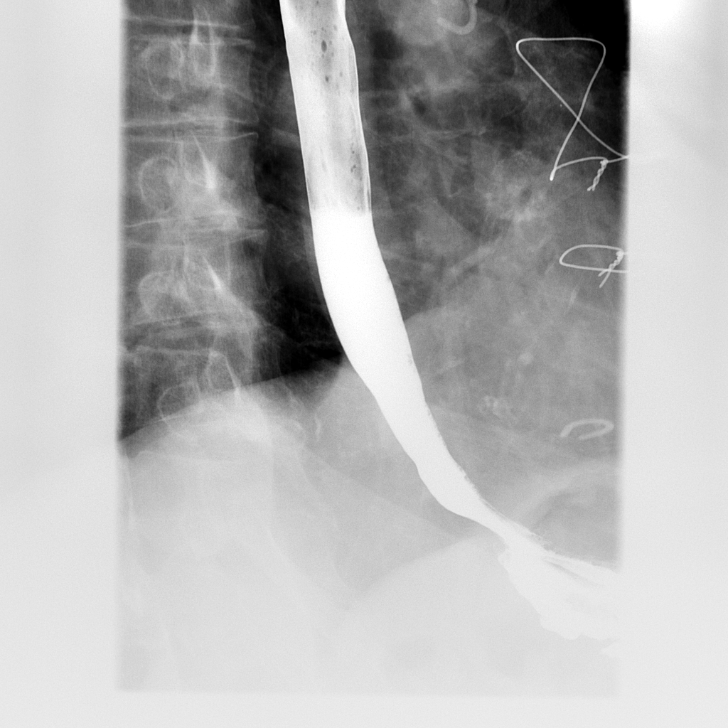

[Series 2: fluoro_barium 2fps_(person_name) · 0.20mm/px · 1 of 1 slices shown (2 of 6)]
[im 1/1]
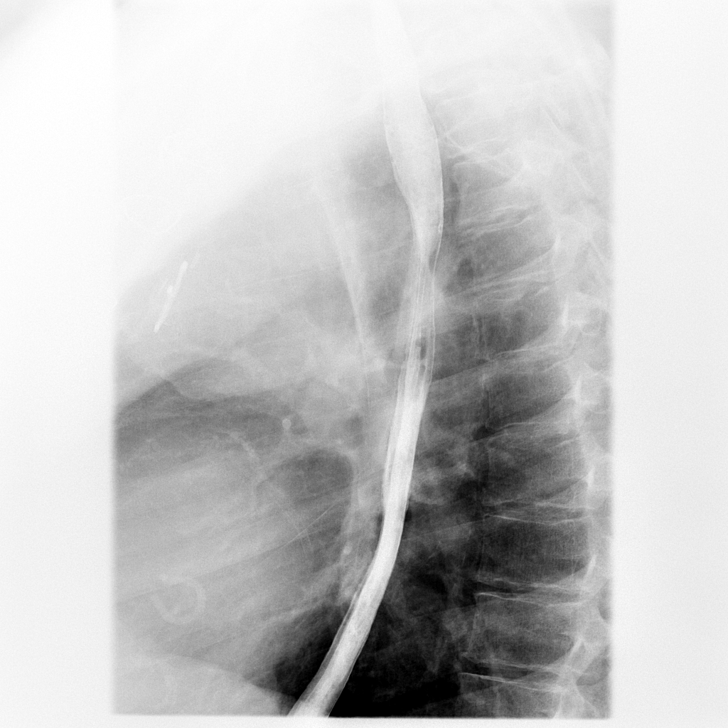

[Series 3: fluoro_barium 2fps_(person_name) · 0.20mm/px · 1 of 1 slices shown (3 of 6)]
[im 1/1]
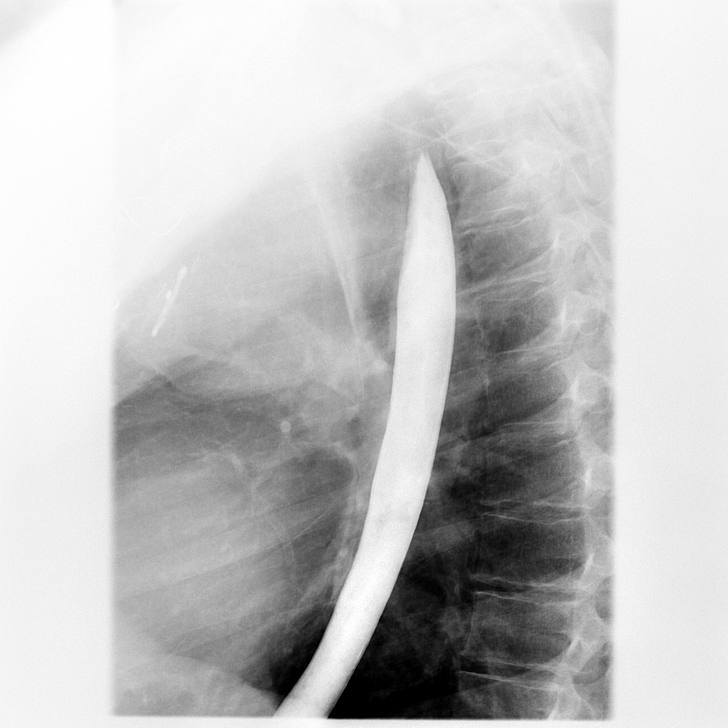

[Series 4: fluoro_barium 2fps_(person_name) · 0.20mm/px · 1 of 1 slices shown (4 of 6)]
[im 1/1]
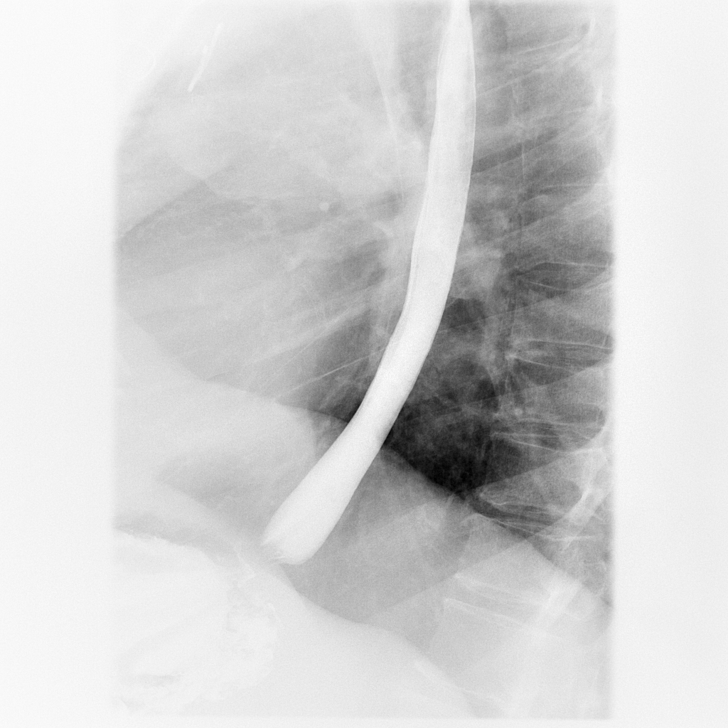

[Series 5: fluoro_barium 2fps_(person_name) · 0.20mm/px · 1 of 1 slices shown (5 of 6)]
[im 1/1]
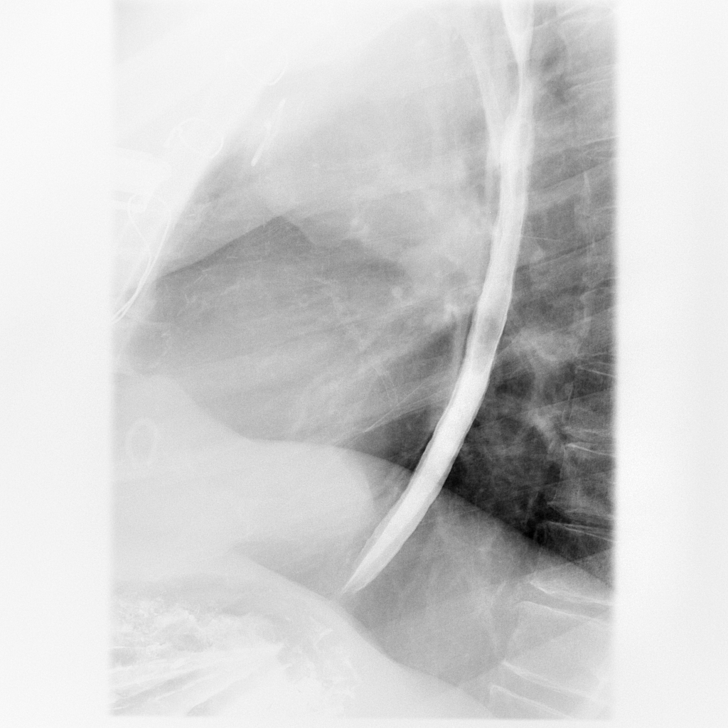

[Series 6: fluoro_barium 2fps_(person_name) · 0.19mm/px · 1 of 1 slices shown (6 of 6)]
[im 1/1]
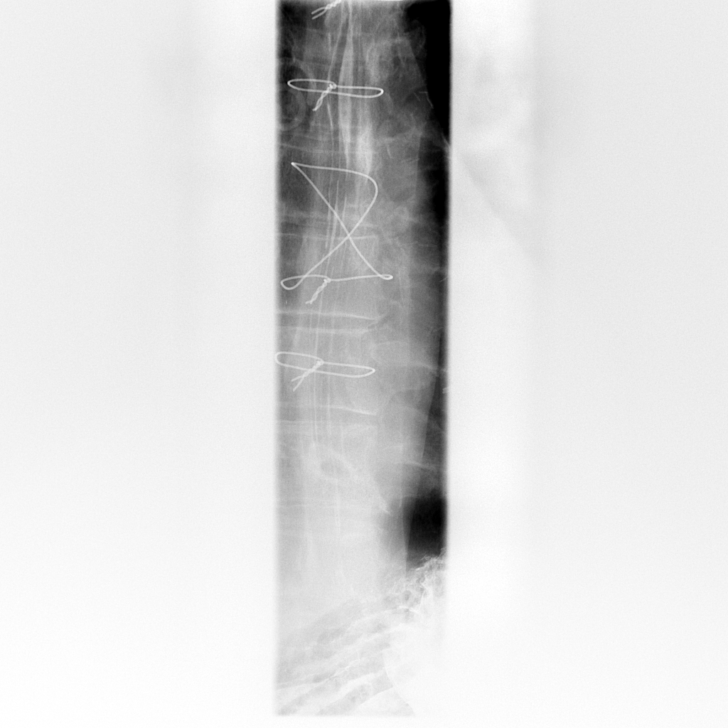

[8 of 8 positions shown; findings below may reference images not displayed]

FINDINGS: Deglutition is normal. Normal esophageal motility is present. No hiatal hernia or reflux is seen. Air-contrast esophagram of esophagus shows no esophageal stricture or evidence of esophagitis.
IMPRESSION: Normal barium swallow with esophagram.

## 2019-10-24 IMAGING — CT CT THORAX WO CONTRAST
2 of 5 series · 11 of 36 positions shown, 13 images · non-contrast
Comparison: None

HISTORY: Prior history of smoking with axillary lymphadenopathy.
TECHNIQUE: Scans of the chest are performed at 5 mm increments with 2 mm lung reconstructions and coronal and sagittal reconstructions without contrast.

Total radiation dose to patient is CTDIvol 9.55 mGy and DLP 378.22 mGy-cm.

[Series 4: lung · axial · 0.56mm/px · z∈[+1451,+1717]mm · 8 of 163 slices shown, 10 images]
[im 20/163  mediastinal]
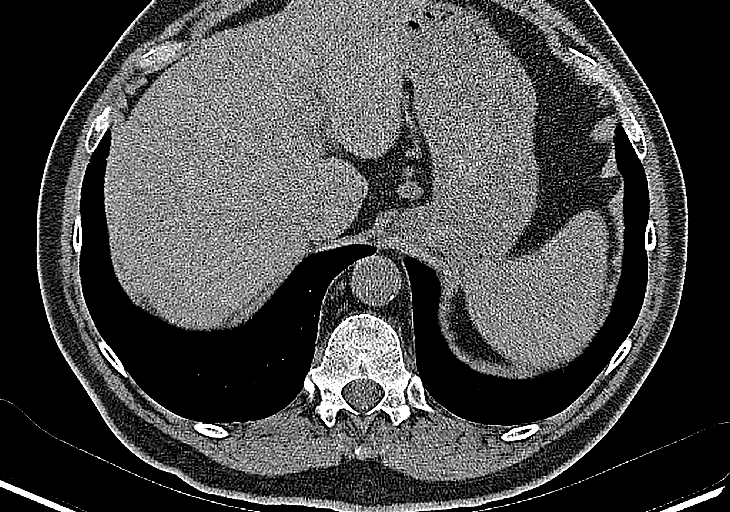
[im 20/163  lung]
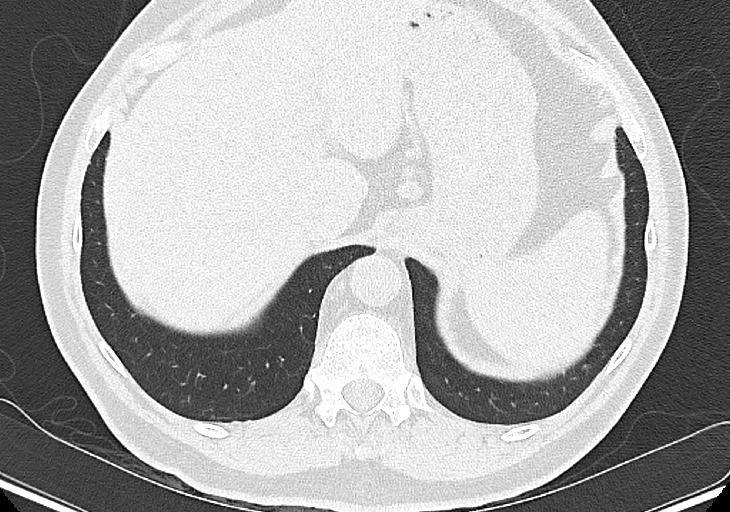
[im 39/163  lung]
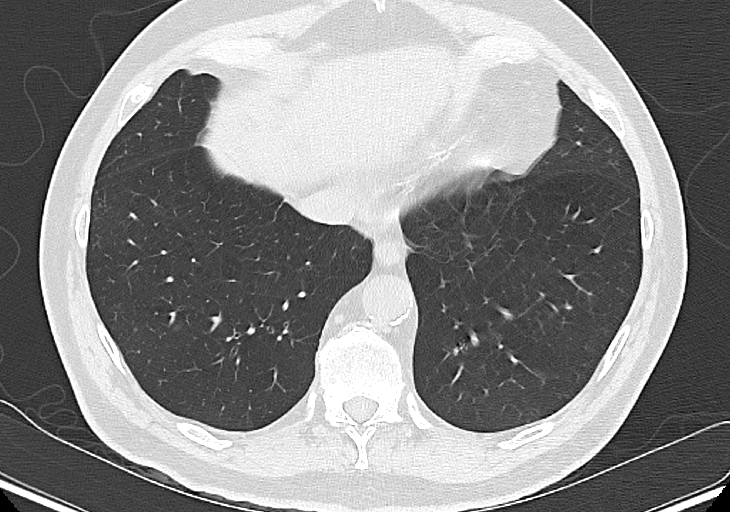
[im 58/163  lung]
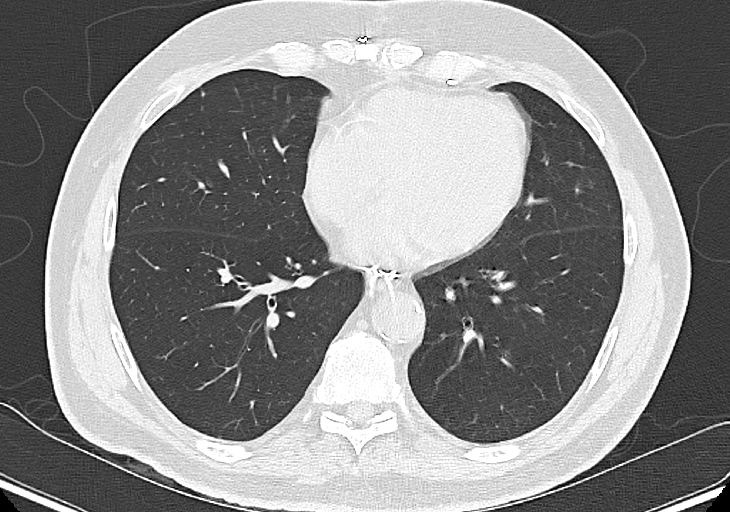
[im 77/163  lung]
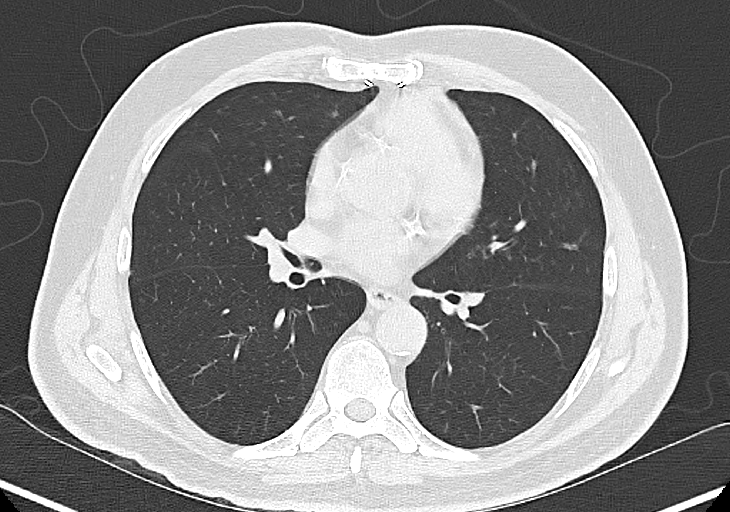
[im 96/163  mediastinal]
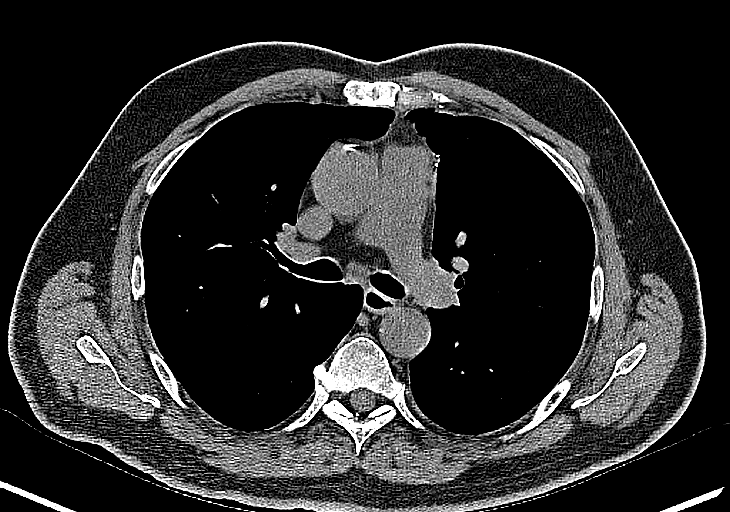
[im 96/163  lung]
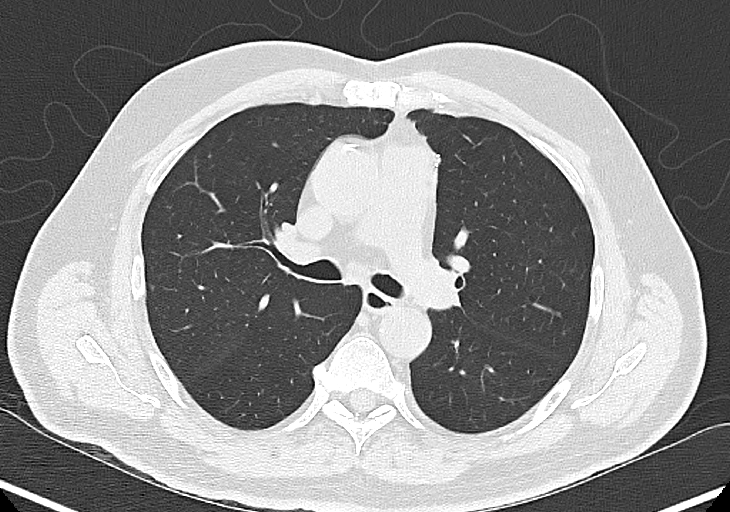
[im 115/163  lung]
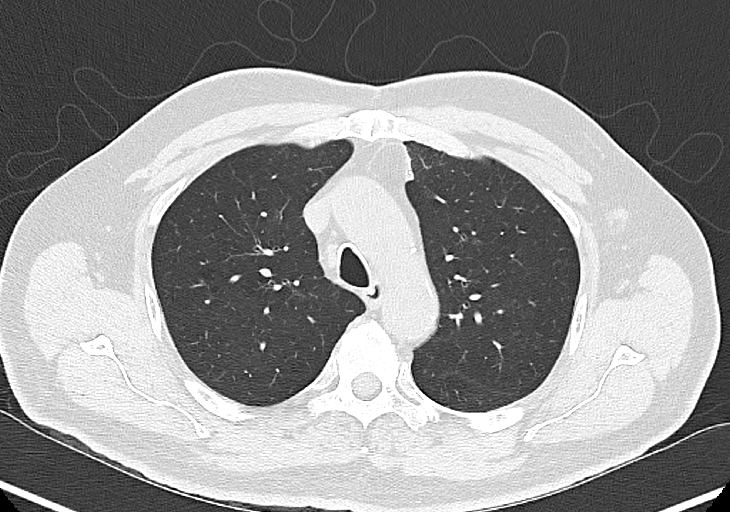
[im 134/163  lung]
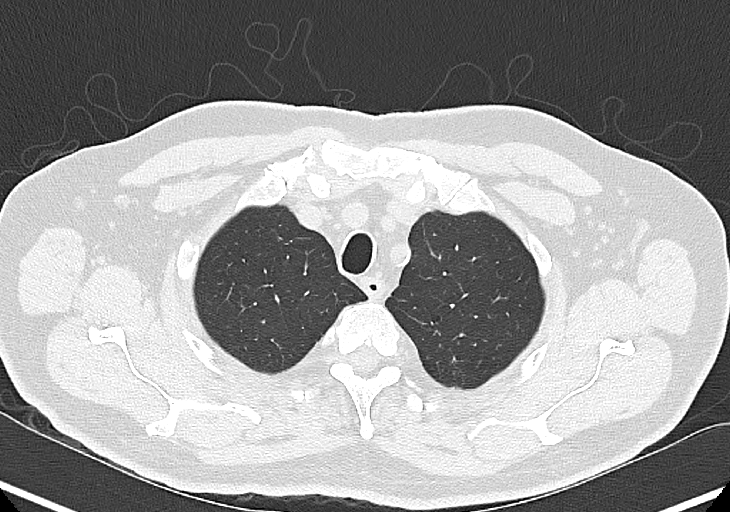
[im 153/163  lung]
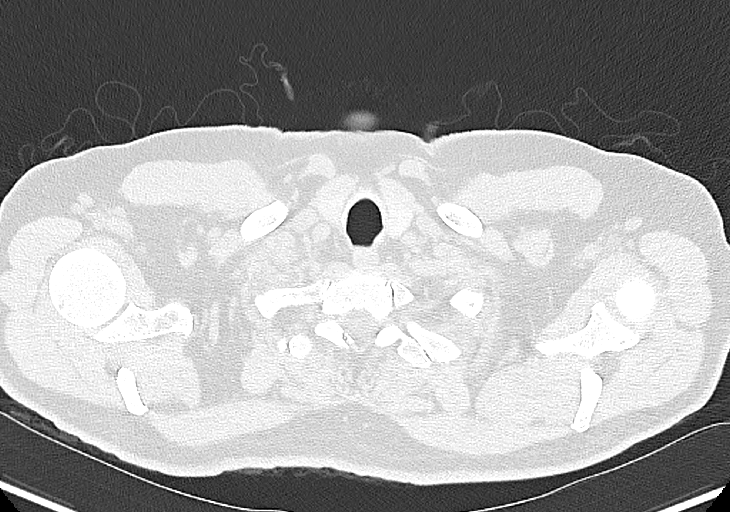

[Series 6: coronal · coronal · 0.72mm/px · 3 of 58 slices shown]
[im 12/58  lung]
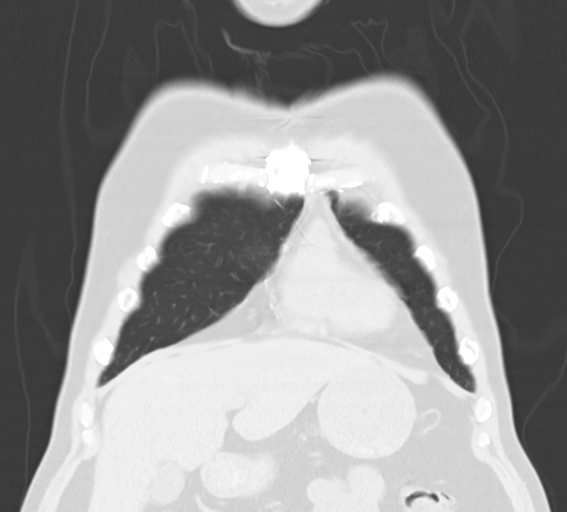
[im 23/58  lung]
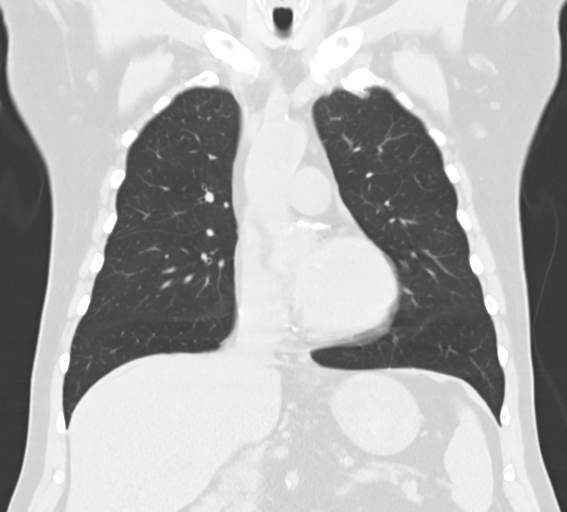
[im 35/58  lung]
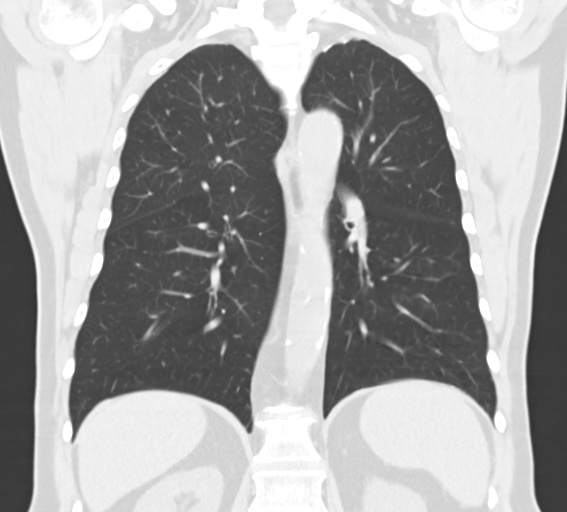

[11 of 36 positions shown; findings below may reference images not displayed]

FINDINGS: The chest wall, axillary regions and thoracic inlet are normal. Small fatty replaced shotty lymph nodes are seen in the right and left axilla with no significant axillary lymphadenopathy.

Atheromatous calcifications are seen in the aorta without aneurysm, brachiocephalic origins and all 3 coronary arteries. Patient is status post median sternotomy for coronary bypass. Small shotty lymph nodes are seen in the middle mediastinum with no significant nodal mass. Heart size is normal and no hiatal hernia is present.

Mild biapical fibropleural thickening is seen with granulomatous type calcification on the left. There are scattered calcified and noncalcified pulmonary nodules in the right and left lung ranging from 1 to 2 mm in diameter. Smaller nodules cannot be accurately measured for calcium content. Findings are almost assuredly benign but can be followed if there is a risk factor for cancer, using [HOSPITAL] guidelines for multiple nodules. No additional pulmonary, tracheobronchial or pleural findings are seen. Scans which include upper abdominal structures show no abnormalities.
IMPRESSION: 1. Shotty fatty replaced lymph nodes in the right and left axillae with no significant lymphadenopathy in the chest.

2. Postop median sternotomy with three-vessel coronary atherosclerosis.

3. Tiny pulmonary nodules as described above and which can be viewed on key images file. Finding can be followed with repeat unenhanced CT chest in one year if there is a risk factor using [HOSPITAL] guidelines.

[HOSPITAL] GUIDELINES FOR PULMONARY NODULE FOLLOWUP 1387

Solid Multiple Nodules

Low Risk*

< 6 mm (<100 mm3):  No routine follow up

6-8 mm (100-250 mm3):  CT at 3-6 months, then consider CT at 18-24 months

> 8 mm (>250 mm3): CT at 3-6 months, then consider CT at 18-24 months

Comment:  Use most suspicious nodule as guide to management. Follow up intervals may vary according to size and risk (recommendation 2A).

High Risk*

< 6 mm (<100 mm3):  Optional CT at 12 months

6-8 mm (100-250 mm3):  CT at 3-6 months, then at 18-24 months

> 8 mm (>250 mm3): CT at 3-6 months, then CT at 18-24 months

Comment:  Use most suspicious nodule as guide to management. Follow up intervals may vary according to size and risk (recommendation 2A).

## 2019-12-06 IMAGING — CT CT ABDOMEN WO CONTRAST
2 of 4 series · 14 of 46 positions shown, 16 images · IV contrast (agent unspecified)
Comparison: Esophagram 09/05/2019

REASON FOR EXAM: Enlarged lymph nodes, unspecified, intra-abdominal and pelvic swelling, mass and lump, unspecified site,
TECHNIQUE: Multiple helical CT images of the abdomen  were acquired without the administration of IV contrast.  Multiplanar reformatted images were also generated. Total radiation dose to patient is CTDIvol 13.52 mGy and DLP 446.22 mGy-cm.

Oral Contrast: 450 mL diluted Gastrografin

[Series 2: soft tissue w/o · axial · non-contrast · 0.70mm/px · z∈[-1414,-1144]mm · 11 of 60 slices shown, 13 images]
[im 3/60  soft-tissue]
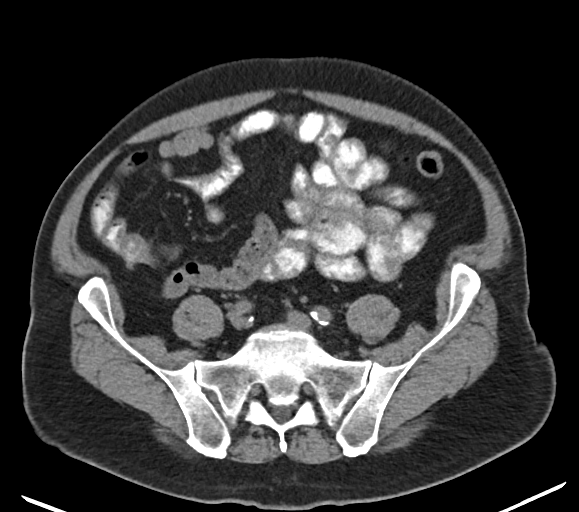
[im 3/60  bone]
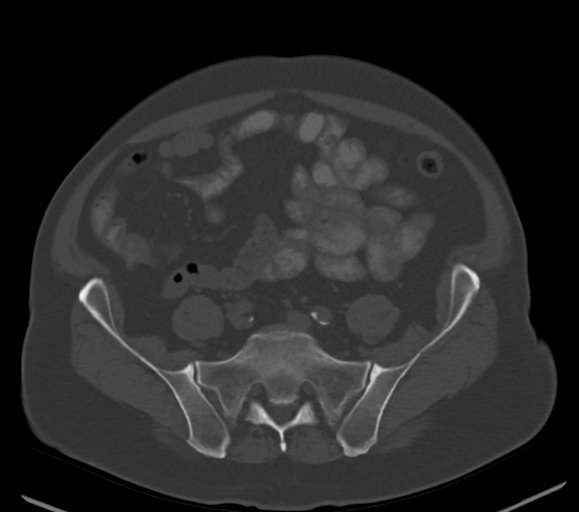
[im 9/60  soft-tissue]
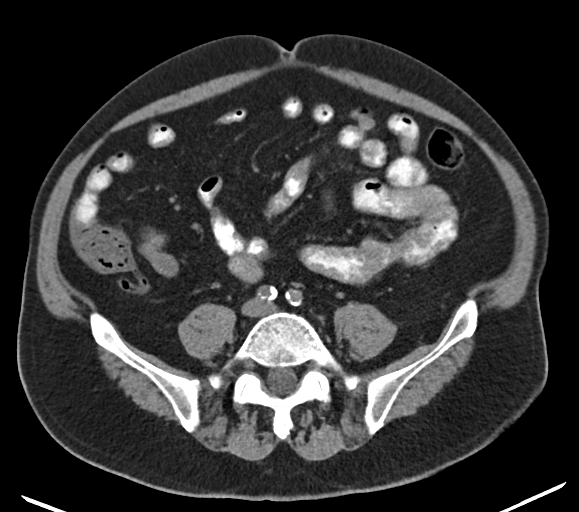
[im 14/60  soft-tissue]
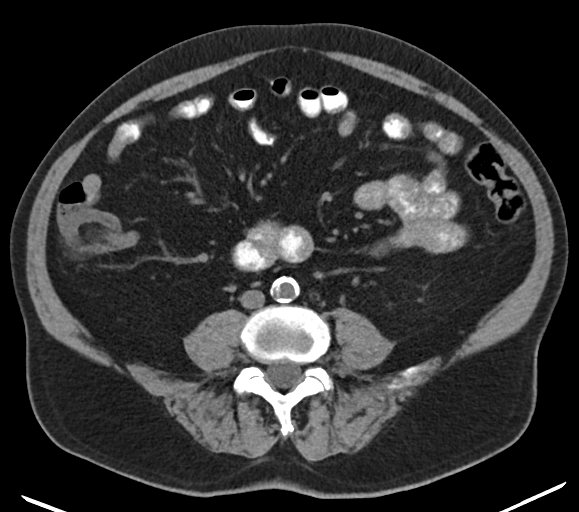
[im 19/60  soft-tissue]
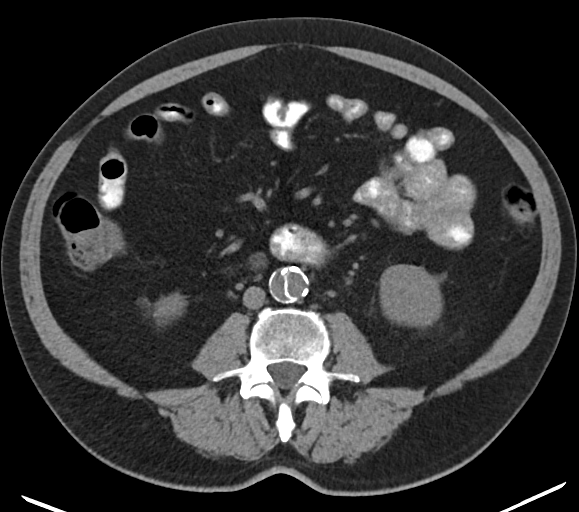
[im 25/60  soft-tissue]
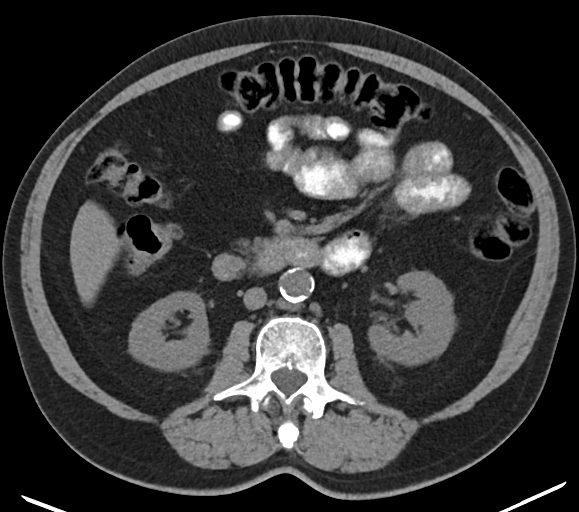
[im 30/60  soft-tissue]
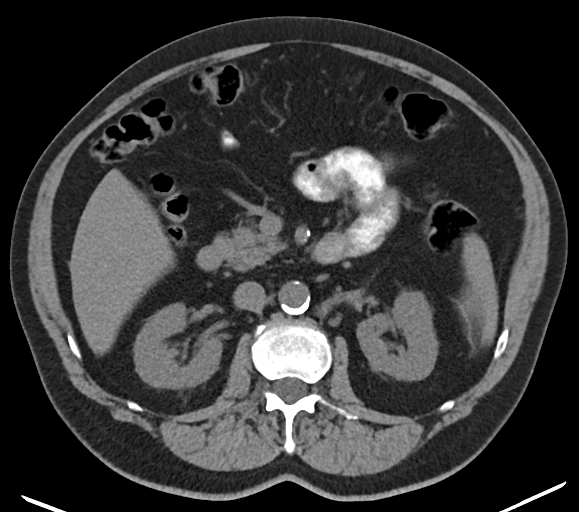
[im 35/60  soft-tissue]
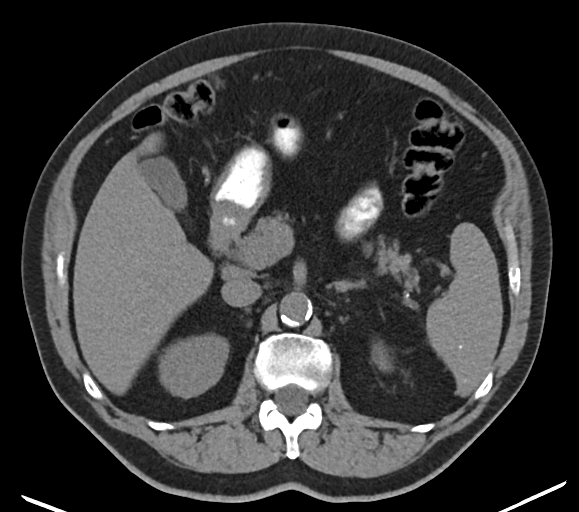
[im 41/60  soft-tissue]
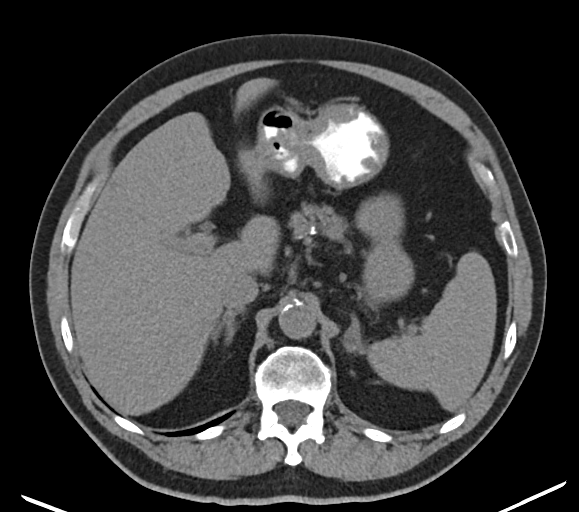
[im 46/60  soft-tissue]
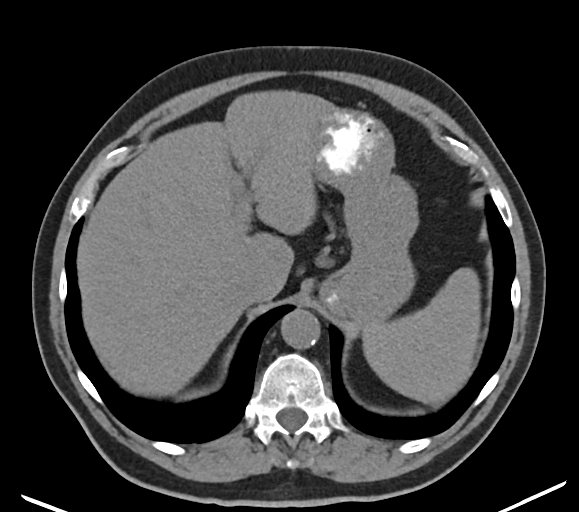
[im 46/60  bone]
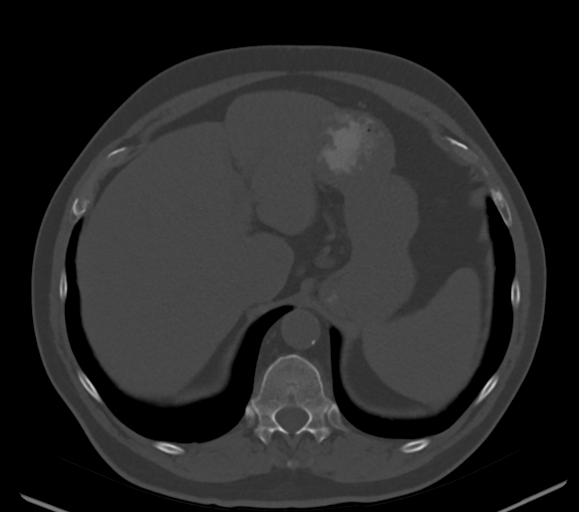
[im 51/60  soft-tissue]
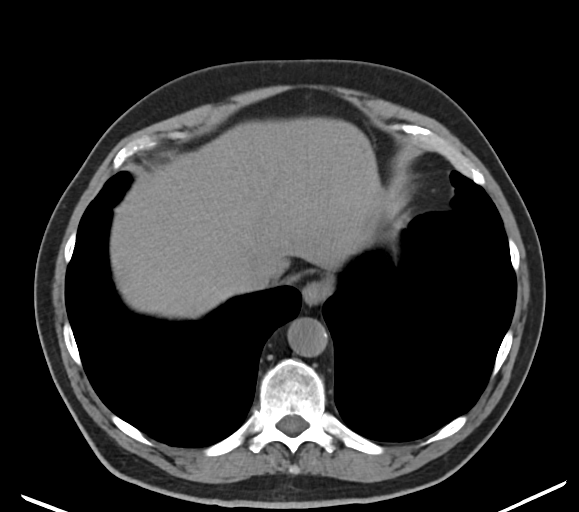
[im 57/60  soft-tissue]
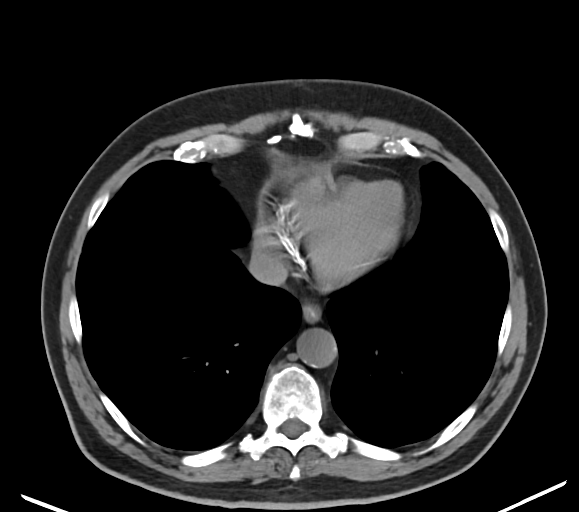

[Series 4: coronal · coronal · 0.59mm/px · 3 of 64 slices shown]
[im 22/64  soft-tissue]
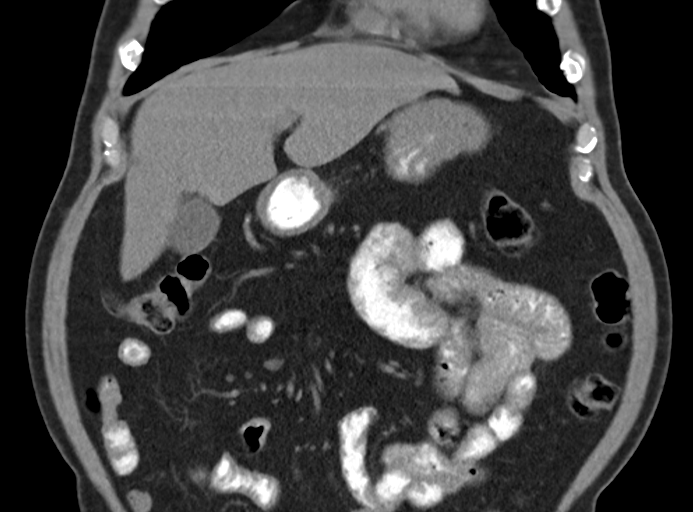
[im 29/64  soft-tissue]
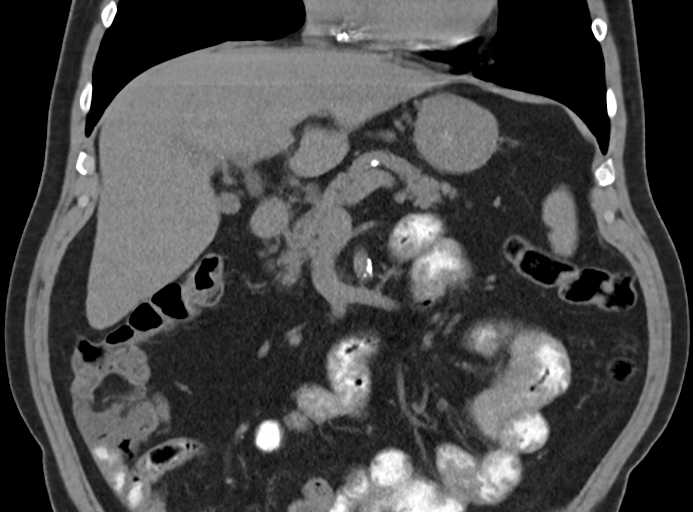
[im 36/64  soft-tissue]
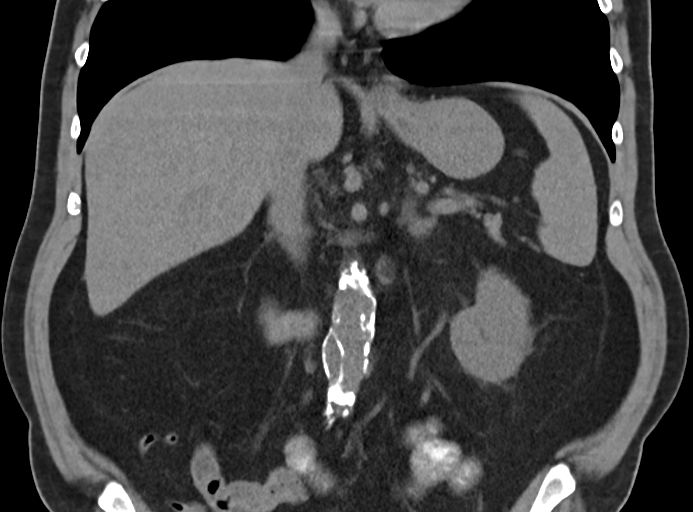

[14 of 46 positions shown; findings below may reference images not displayed]

FINDINGS: In hepatic segment III (series 2, image 18), a subcentimeter low-attenuation lesion is most likely a cyst. Contour of the liver is not nodular.

The gallbladder is nondistended. No biliary dilatation.

The spleen measures 12 cm in the axial plane and contains several calcified granulomas. Unremarkable pancreas and right adrenal glands. Adenomatous thickening of the left adrenal gland.

The left kidney has a 2 mm nonobstructive stone in an inferior calyx. Small exophytic cyst at the right renal inferior pole. No hydronephrosis.

Unremarkable distal esophagus. The proximal stomach is nondistended. Oral contrast is readily coursing through the small bowel. The appendix is normal. Colonic stool burden is mild.

No free air or free fluid in the abdomen. Severe atherosclerotic disease. No enlarged lymph nodes in the abdominal mesentery or retroperitoneum. No significant fascial defect in the visualized abdominal wall.

No destructive lesion in the lumbar spine. The lung bases are clear.
IMPRESSION: No mass or lymphadenopathy in the abdomen (by noncontrast technique).

No ascites.

Normal size spleen.

## 2021-04-26 IMAGING — MR MRI ABDOMEN WO/W CONTRAST
6 of 11 series · 16 of 48 positions shown · IV contrast (prohance)
Comparison: CT abdomen without contrast 12/06/2019

HISTORY: Duodenal adenoma.
TECHNIQUE: Multiplanar multisequence MRI of the abdomen without and with contrast was performed.

Contrast dose: 15 mL ProHance

[Series 1: bSSFP · axial · 8.0mm · 1.76mm/px · 1 of 22 slices shown]
[im 1/22]
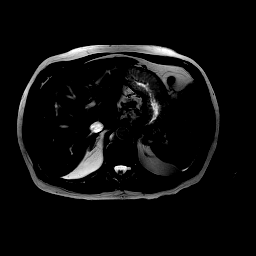

[Series 2: t2_cor_haste · coronal · 8.0mm · 0.78mm/px · 1 of 24 slices shown]
[im 1/24]
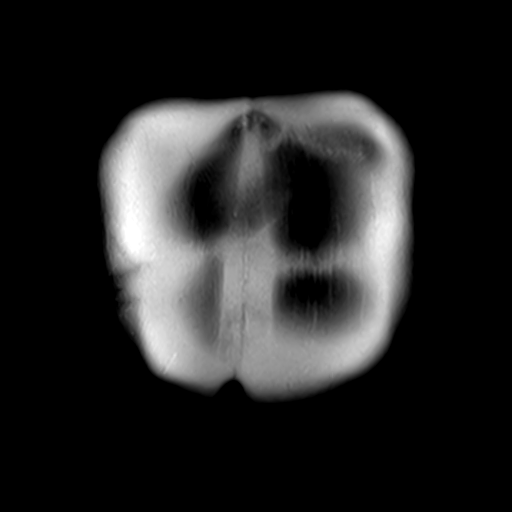

[Series 3: t2_axial_fs · axial · 7.0mm · 1.68mm/px · z∈[-100,+110]mm · 2 of 26 slices shown]
[im 1/26]
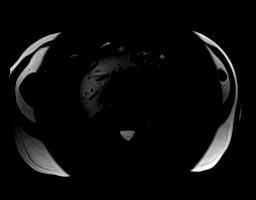
[im 26/26]
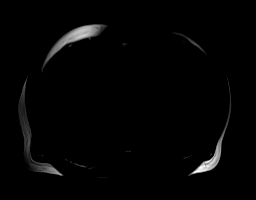

[Series 4: t2_axial_haste · axial · 6.0mm · 0.73mm/px · z∈[-94,+72]mm · 2 of 24 slices shown]
[im 1/24]
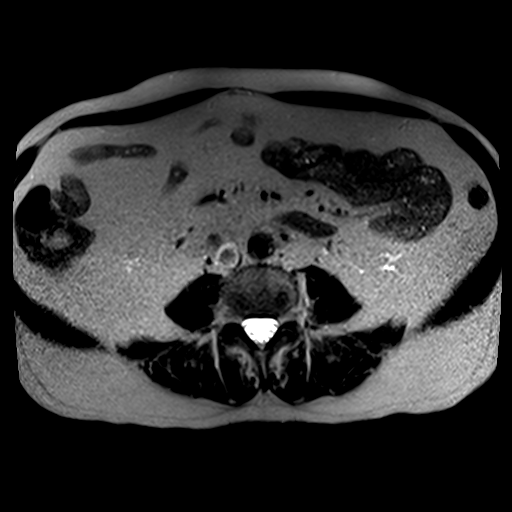
[im 24/24]
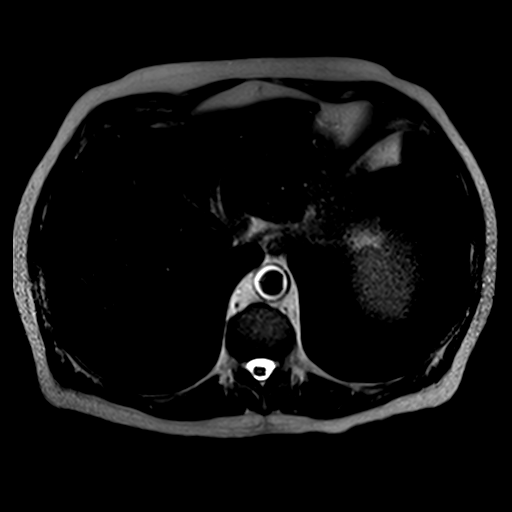

[Series 5: t1_vibe_(person_name)_axial_fs_in · axial · 3.5mm · 0.69mm/px · z∈[-100,+106]mm · 6 of 60 slices shown]
[im 1/60]
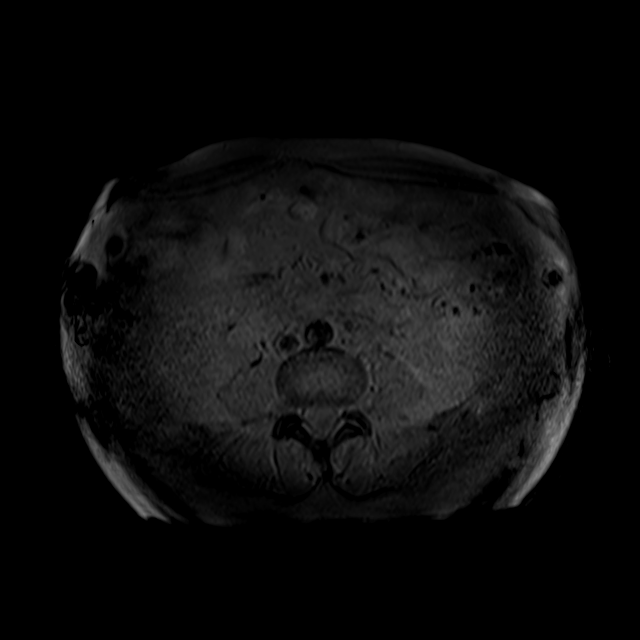
[im 12/60]
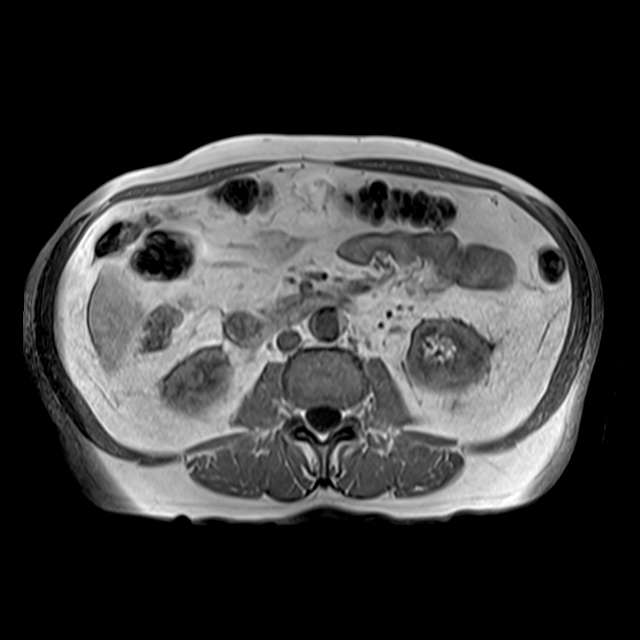
[im 24/60]
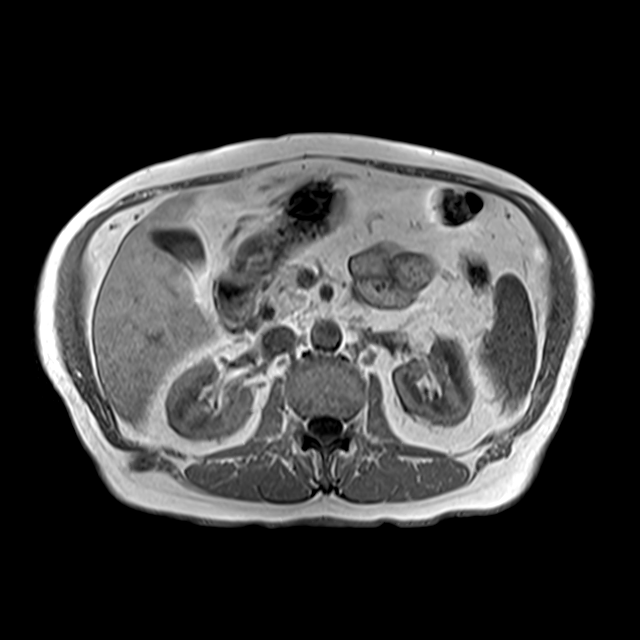
[im 36/60]
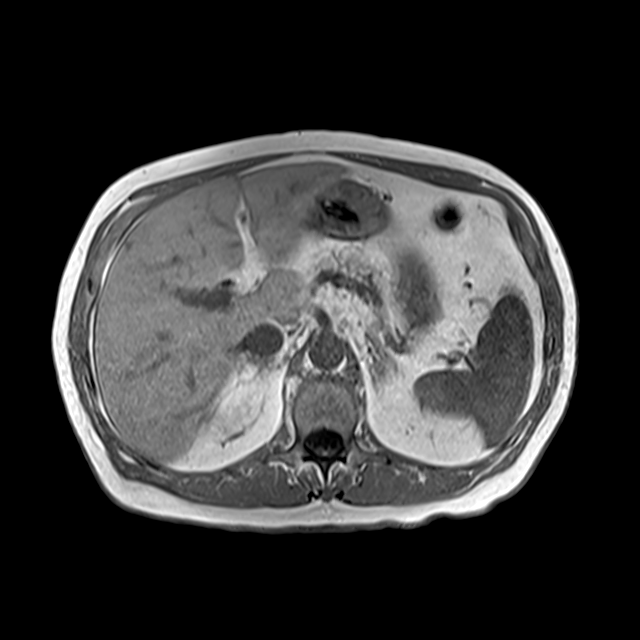
[im 48/60]
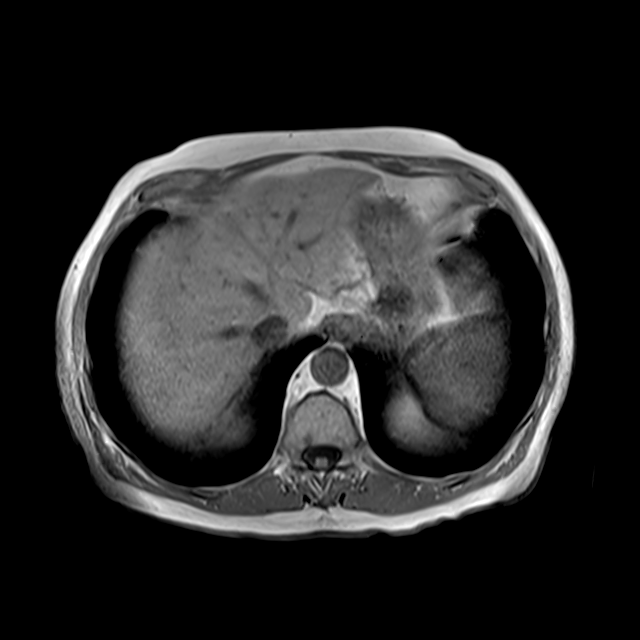
[im 60/60]
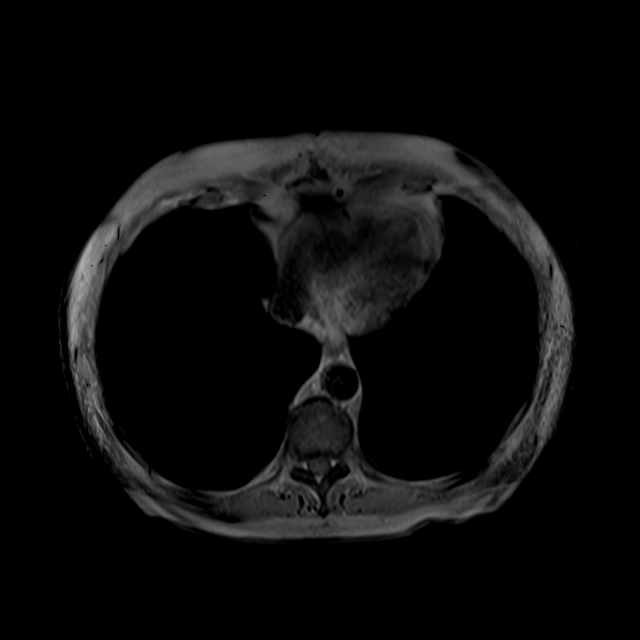

[Series 6: t1_vibe_(person_name)_axial_fs_opp · axial · 3.5mm · 0.69mm/px · z∈[-100,+22]mm · 4 of 60 slices shown]
[im 1/60]
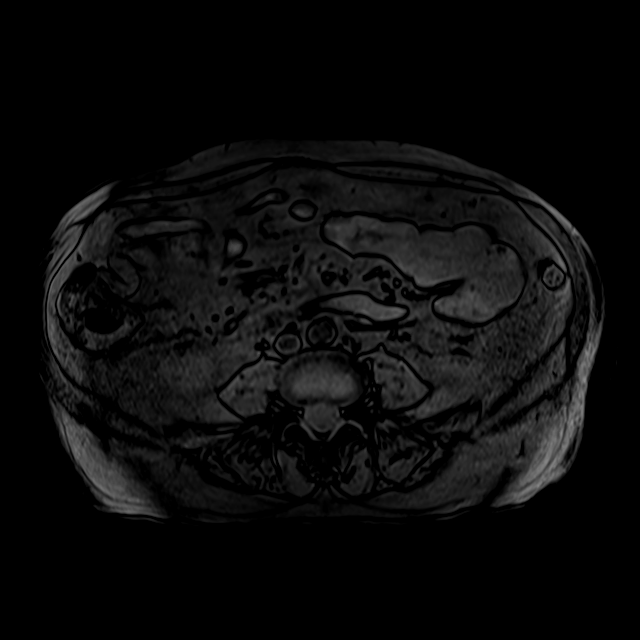
[im 12/60]
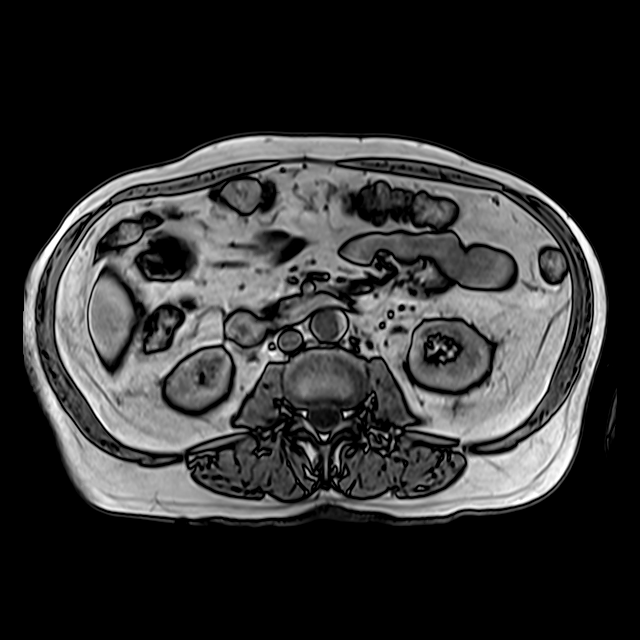
[im 24/60]
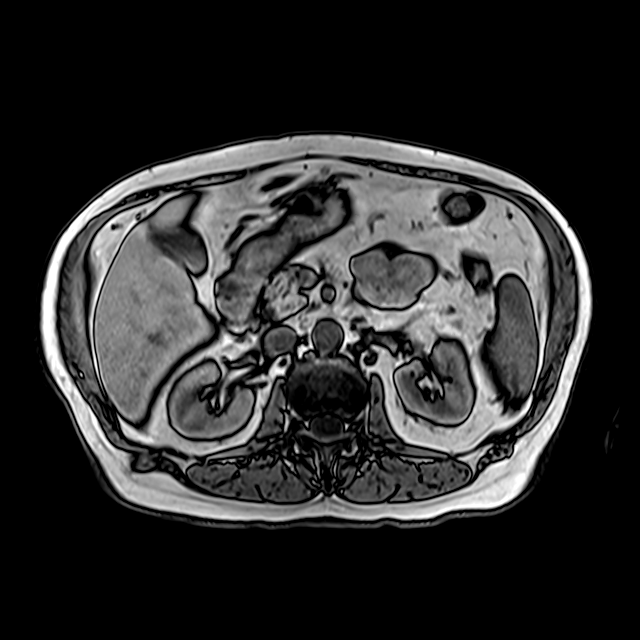
[im 36/60]
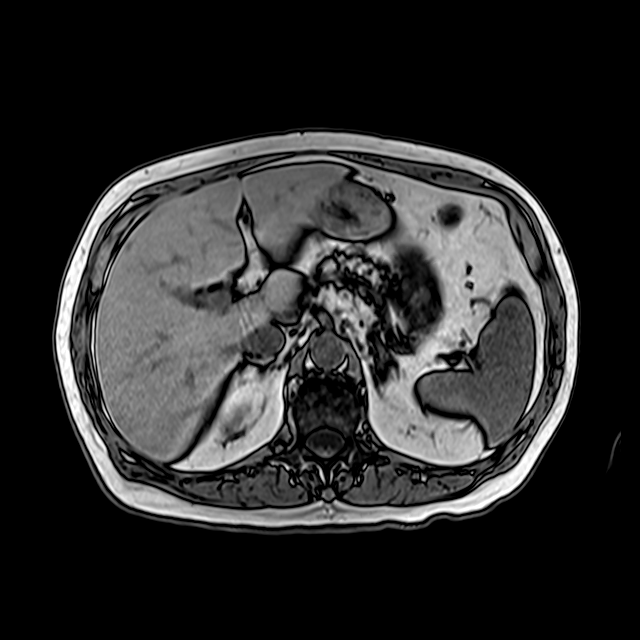

[16 of 48 positions shown; findings below may reference images not displayed]

FINDINGS: There is limited distention of the stomach and duodenum. The duodenal mass as per patient history is not well delineated. There is some fullness to the region of the junction of the first and second portions of the duodenum.

There are a few tiny scattered hepatic cysts. No enhancing hepatic lesion is seen. The gallbladder is unremarkable. There is no biliary ductal dilatation seen.

The spleen and pancreas are unremarkable. The pancreatic duct has its expected insertion into the second portion of the duodenum.

There is an oval nodule in the medial limb of the left adrenal gland measuring 1.7 cm demonstrating signal drop on the out-of-phase sequence most consistent with an adrenal adenoma.

There is a small right lower pole renal cyst. Note is made of Tarlov's cyst in the region of the sacrum.

There is ectasia of the infrarenal abdominal aorta measuring 2.6 cm with a short segment dissection similar to previous examination 12/06/2019. Incidental note is made of a circumaortic left renal vein.
IMPRESSION: 1.
 The duodenal mass as per patient history is not well delineated on this examination.

2.
Small left adrenal adenoma.

3.
Tiny hepatic cysts and small right lower pole renal cyst.

4.
Ectasia of the infrarenal abdominal aorta with a short segment dissection similar to appearance on previous comparison CT 12/06/2019.

## 2022-02-04 IMAGING — CT CT CHEST/ABD/PELVIS WO CONT
2 of 5 series · 12 of 46 positions shown, 14 images · non-contrast
Comparison: 12/06/2019 and chest from 10/24/2019

Coronary Artery Calcification: Present.

HISTORY: Other diseases of stomach and duodenum.
TECHNIQUE: Axial images of the chest, abdomen and pelvis were obtained from the lung apices through the pubic symphysis without intravenous contrast administration.  Dose reduction technique used: Automated exposure control and adjustment of the mA and/or kV according to patient size. CT Studies and Cardiac Nuclear Medicine Studies in last 12-months = 0

[Series 3: soft tissue · axial · 0.70mm/px · z∈[-1563,-958]mm · 9 of 147 slices shown, 11 images]
[im 13/147  soft-tissue]
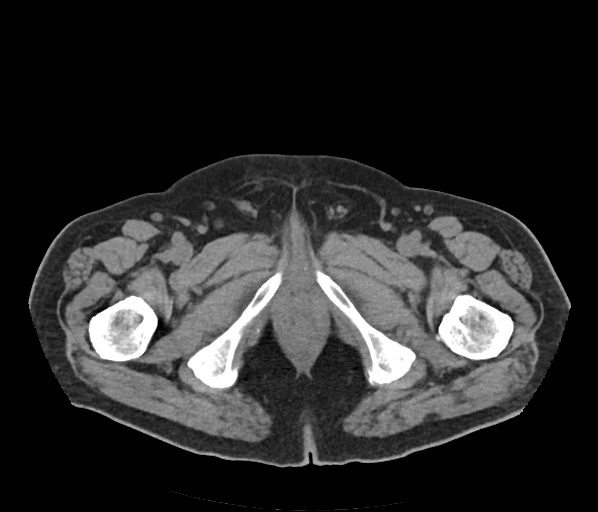
[im 13/147  bone]
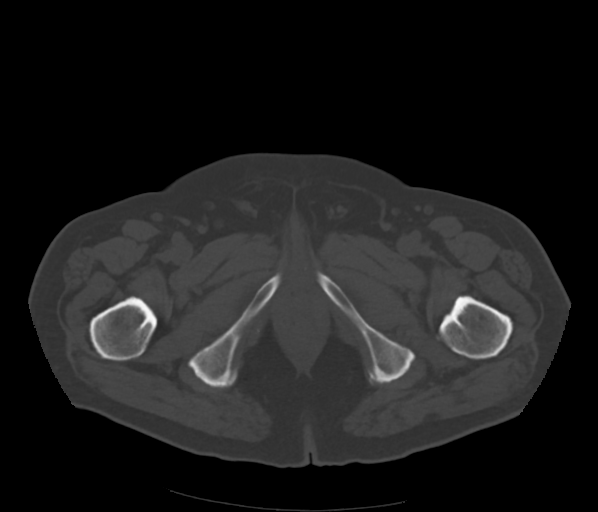
[im 25/147  soft-tissue]
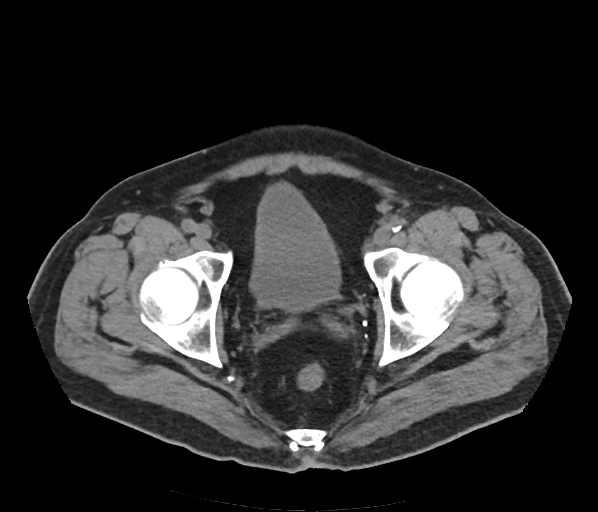
[im 49/147  soft-tissue]
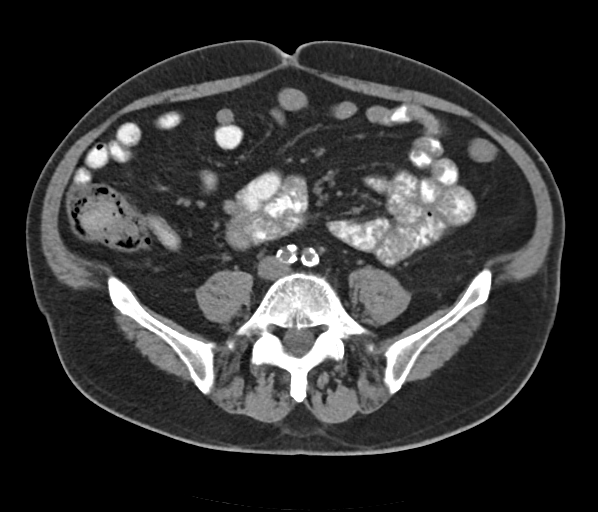
[im 61/147  soft-tissue]
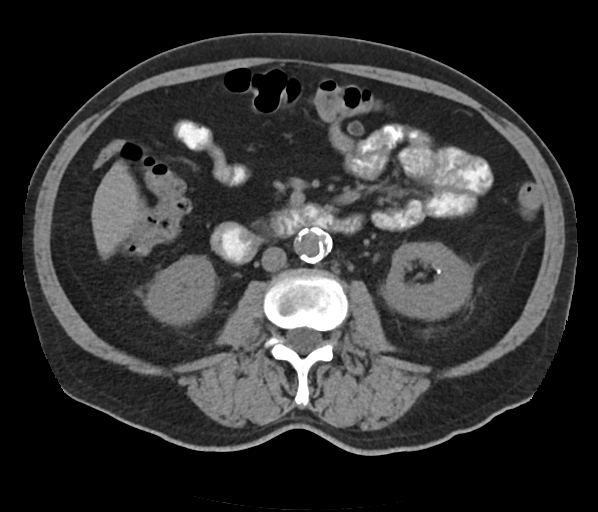
[im 74/147  soft-tissue]
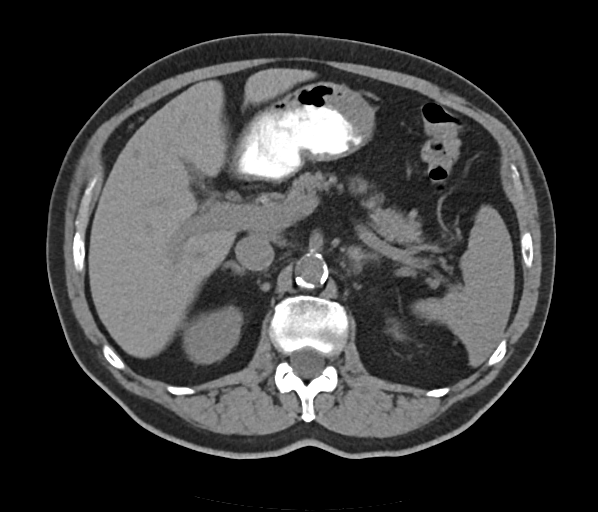
[im 86/147  soft-tissue]
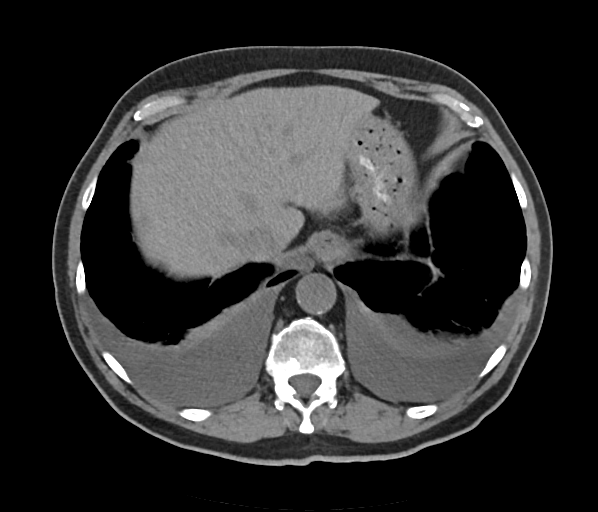
[im 98/147  soft-tissue]
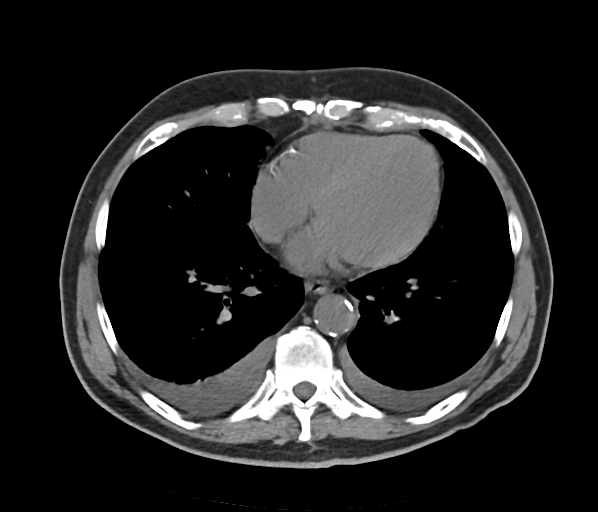
[im 122/147  soft-tissue]
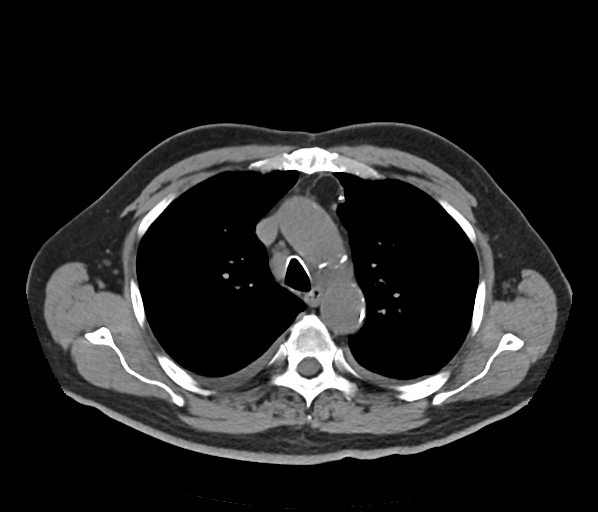
[im 134/147  soft-tissue]
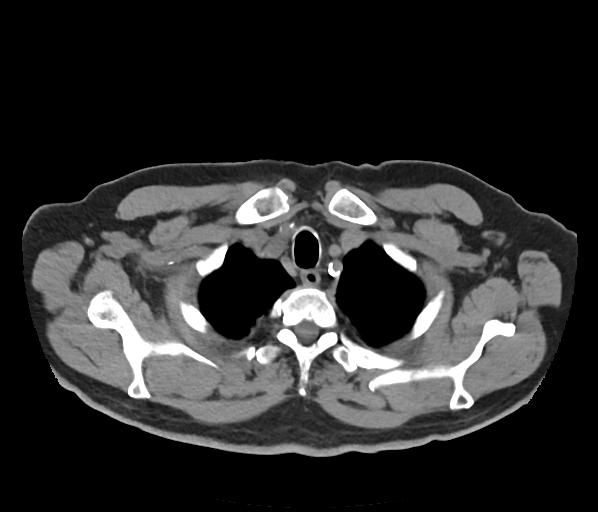
[im 134/147  bone]
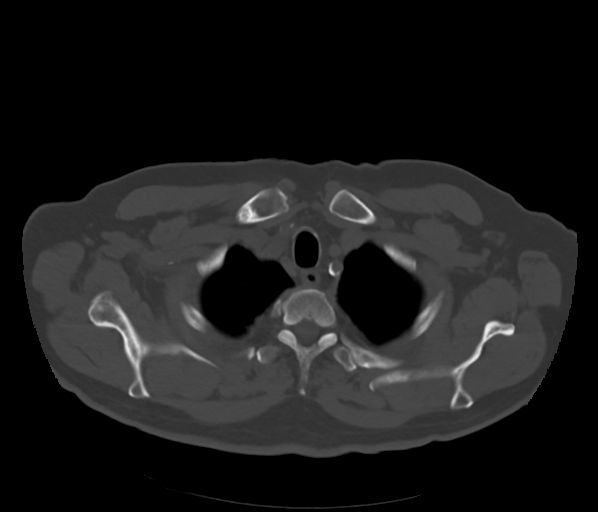

[Series 7: coronal · coronal · 0.82mm/px · 3 of 61 slices shown]
[im 21/61  soft-tissue]
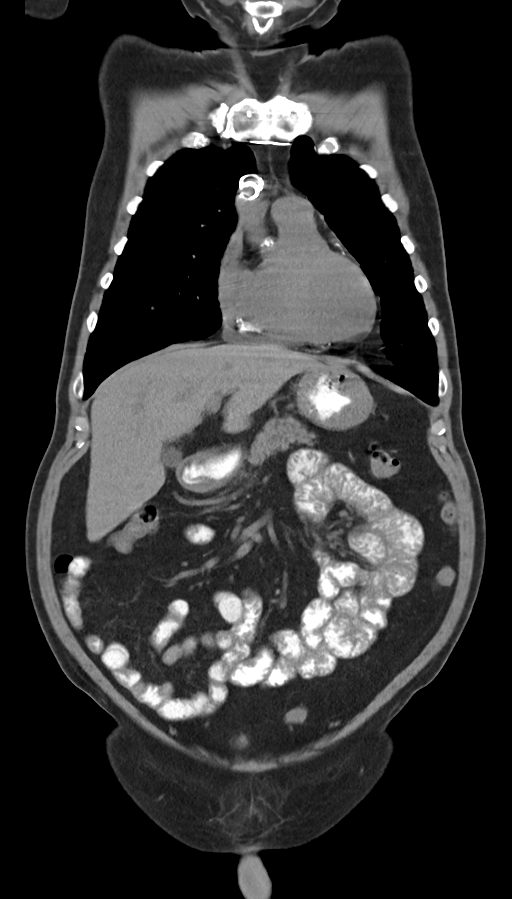
[im 27/61  soft-tissue]
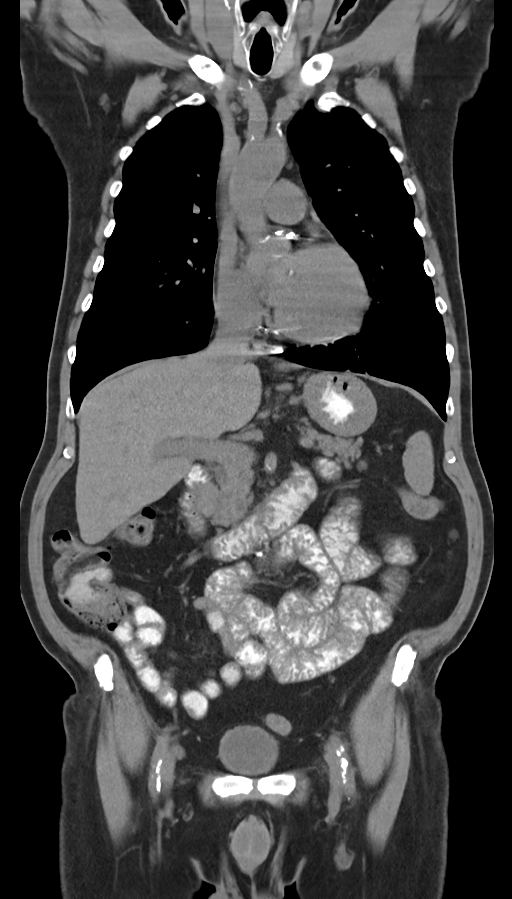
[im 34/61  soft-tissue]
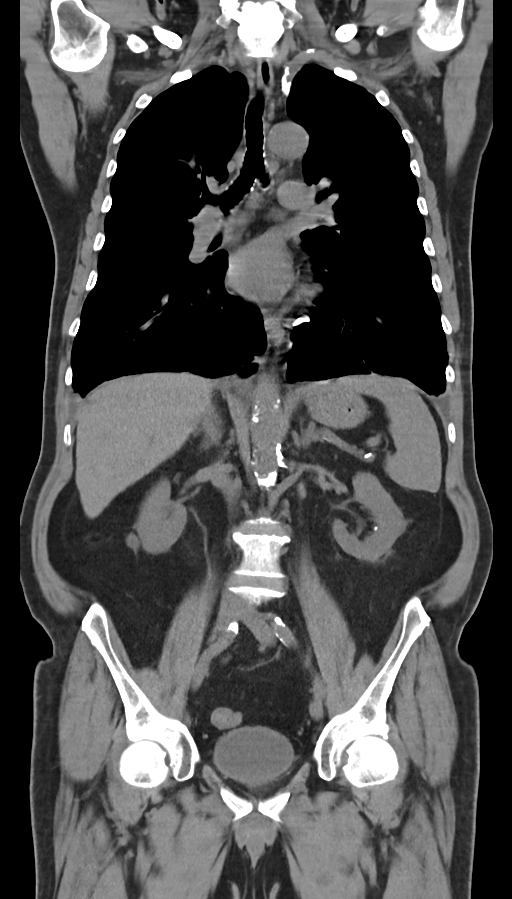

[12 of 46 positions shown; findings below may reference images not displayed]

FINDINGS: CHEST:  

The central airways are patent.

The lungs show increased interstitial markings at the lung bases. There is some peribronchial cuffing present. Mild bibasilar atelectatic bands are seen adjacent to bilateral pleural effusions, small to at most moderate in size.

The heart is normal in size without significant pericardial effusion.

A cluster of prominent central mediastinal lymph nodes are noted, the largest measures 12 mm short axis.

ABDOMEN AND PELVIS: 

The liver has a normal density without suspicious abnormality. The spleen shows some small calcified granulomata inferiorly. The gallbladder, pancreas and right adrenal are normal. The left adrenal has a 15 mm low-density nodule within it with Hounsfield units in the teens. This is unchanged from previous exam.

The kidneys have a normal size and position without hydronephrosis. A small calcification is seen in the inferior left renal collecting system. The retroperitoneum does not show any abnormally enlarged lymph nodes. There is no evidence of iliac or inguinal adenopathy. Urinary bladder is moderately well-distended. Prostate is enlarged.

GI tract is not obstructed. There is no free fluid or extraluminal gas identified.

The aorta shows severe atherosclerotic plaquing. Curvilinear calcification traverses the center of the infrarenal aorta, unchanged from previous exam.

The osseous structures show sternotomy wires.
IMPRESSION: 1.
Interstitial lung disease with peribronchial cuffing.

2.
Bilateral pleural effusions.

3.
Prominent central mediastinal lymph nodes, nonspecific in nature.

4.
Severe atherosclerotic disease. Large ulcerated plaque or short focal dissection in the infrarenal aorta noted.

5.
Left adrenal adenoma.

6.
Left nephrolithiasis.

7.
Enlarged prostate.

Total radiation dose to patient is CTDIvol 10.70 mGy and DLP 828.00 mGy-cm.

## 2022-03-24 IMAGING — MR MRI BRAIN WO CONTRAST
9 series · 48 of 48 positions shown · IV contrast (agent unspecified)
Comparison: None.

Images Obtained from [facility]
REASON FOR EXAM: Mild cognitive impairment of uncertain or unknown etiology.
TECHNIQUE: Multiplanar, multisequence MRI images of the brain are obtained without intravenous contrast.
CONTRAST: None

[Series 1: bSSFP · axial · 8.0mm · 1.17mm/px · 1 of 19 slices shown]
[im 1/19]
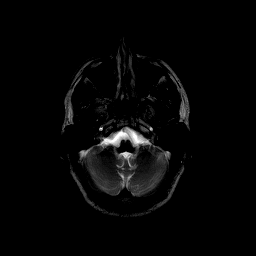

[Series 2: t1_mprage_axial · axial · 1.0mm · 1.00mm/px · z∈[-66,+93]mm · 11 of 160 slices shown]
[im 1/160]
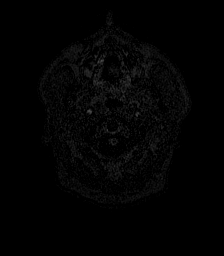
[im 16/160]
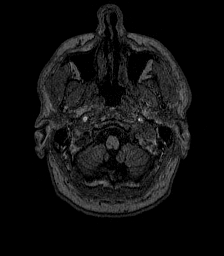
[im 32/160]
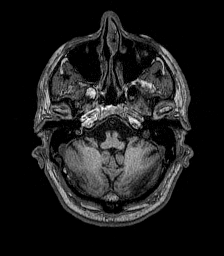
[im 48/160]
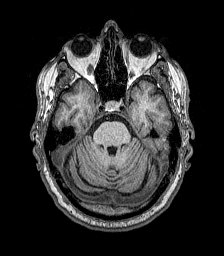
[im 64/160]
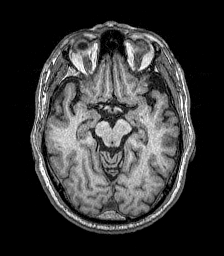
[im 80/160]
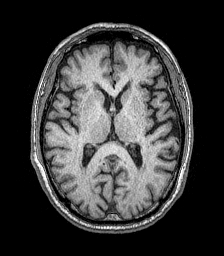
[im 96/160]
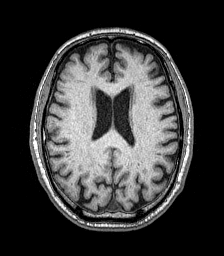
[im 112/160]
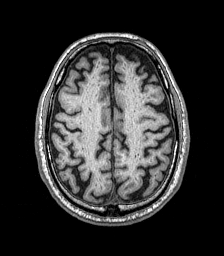
[im 128/160]
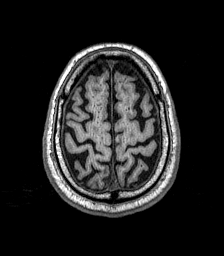
[im 144/160]
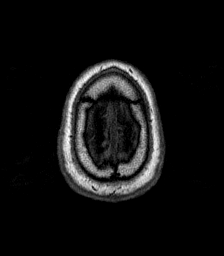
[im 160/160]
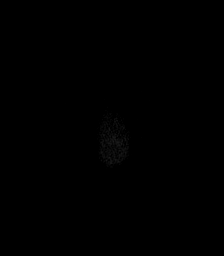

[Series 3: flair_axial_fs · axial · 5.0mm · 0.45mm/px · z∈[-68,+88]mm · 2 of 26 slices shown]
[im 1/26]
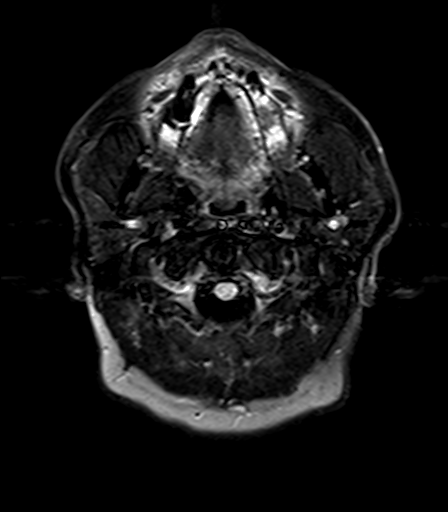
[im 26/26]
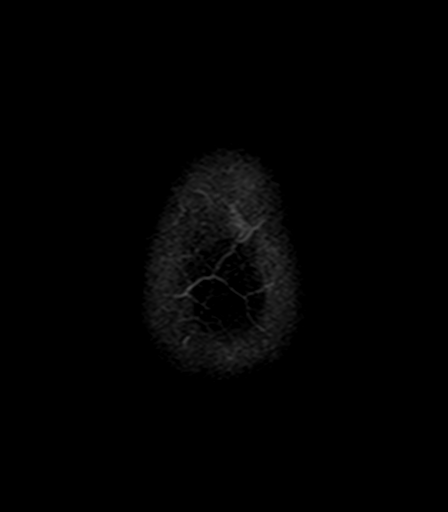

[Series 4: t1_mprage_cor_reformat · coronal · 1.5mm · 1.00mm/px · 14 of 201 slices shown]
[im 1/201]
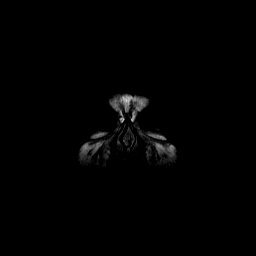
[im 16/201]
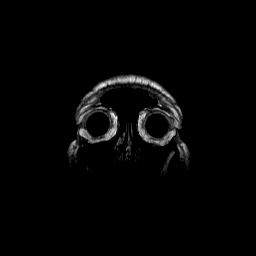
[im 31/201]
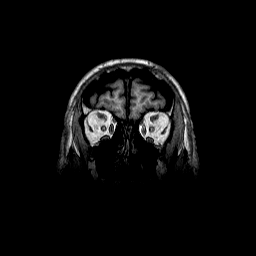
[im 47/201]
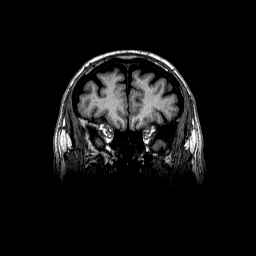
[im 62/201]
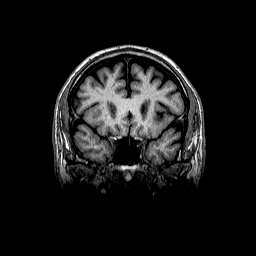
[im 77/201]
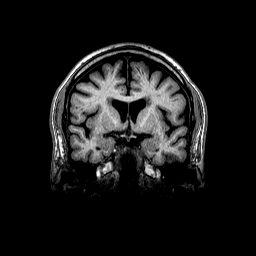
[im 93/201]
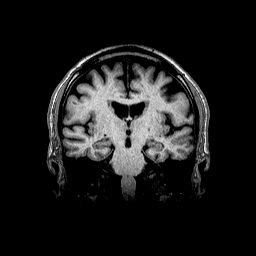
[im 108/201]
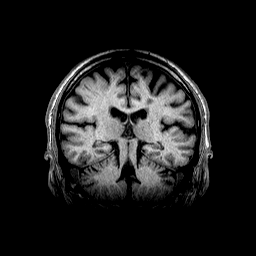
[im 124/201]
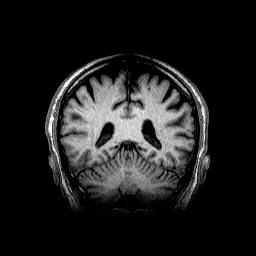
[im 139/201]
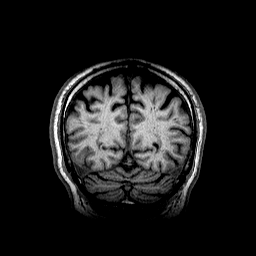
[im 154/201]
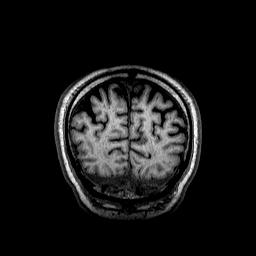
[im 170/201]
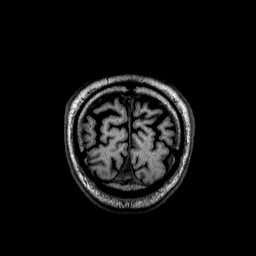
[im 185/201]
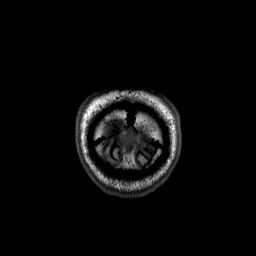
[im 201/201]
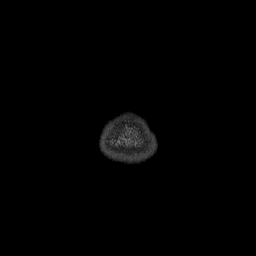

[Series 5: t1_mprage_sag_reformat · sagittal · 1.5mm · 1.00mm/px · 12 of 161 slices shown]
[im 1/161]
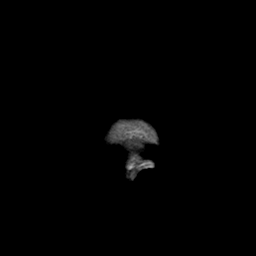
[im 15/161]
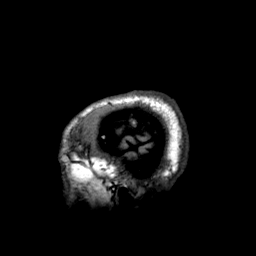
[im 30/161]
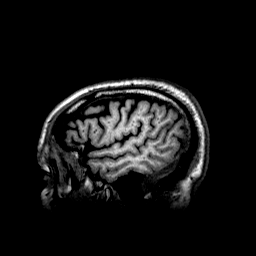
[im 44/161]
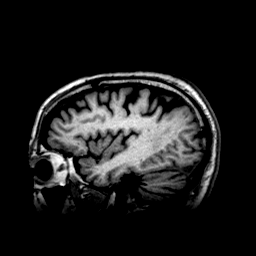
[im 59/161]
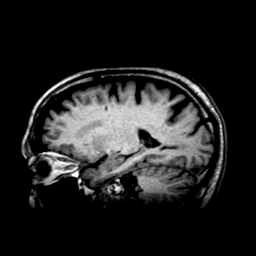
[im 73/161]
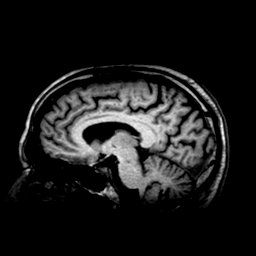
[im 88/161]
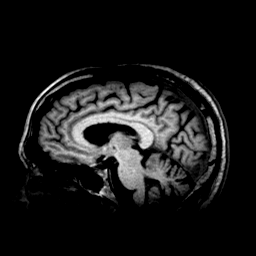
[im 102/161]
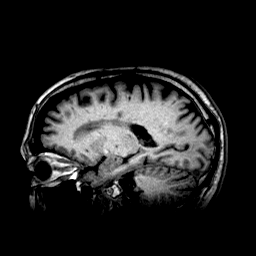
[im 117/161]
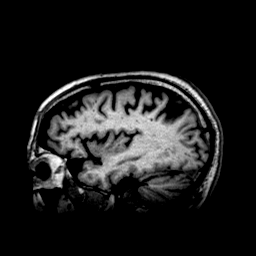
[im 131/161]
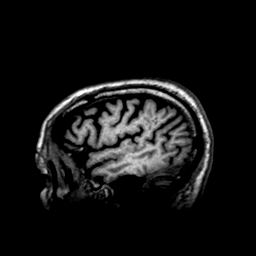
[im 146/161]
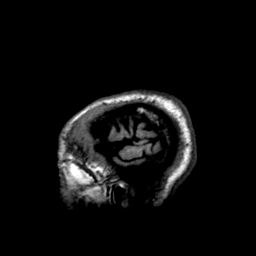
[im 161/161]
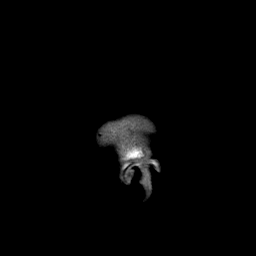

[Series 6: t2_axial · axial · 5.0mm · 0.72mm/px · z∈[-68,+88]mm · 2 of 26 slices shown]
[im 1/26]
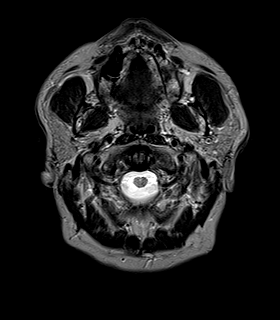
[im 26/26]
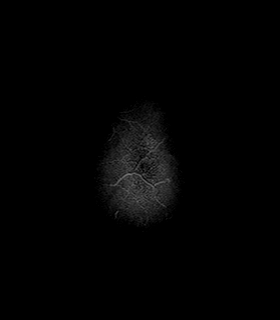

[Series 7: DWI · axial · 5.0mm · 1.26mm/px · z∈[-68,+88]mm · 2 of 26 slices shown (1 of 2)]
[im 1/26]
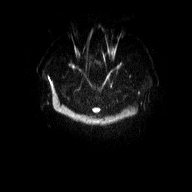
[im 26/26]
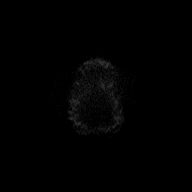

[Series 8: DWI · axial · 5.0mm · 1.26mm/px · z∈[-68,+88]mm · 2 of 26 slices shown (2 of 2)]
[im 1/26]
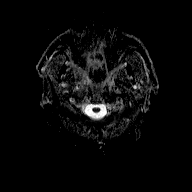
[im 26/26]
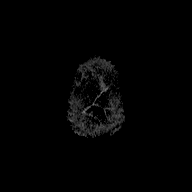

[Series 9: flash_axial · axial · 5.0mm · 0.45mm/px · z∈[-68,+88]mm · 2 of 26 slices shown]
[im 1/26]
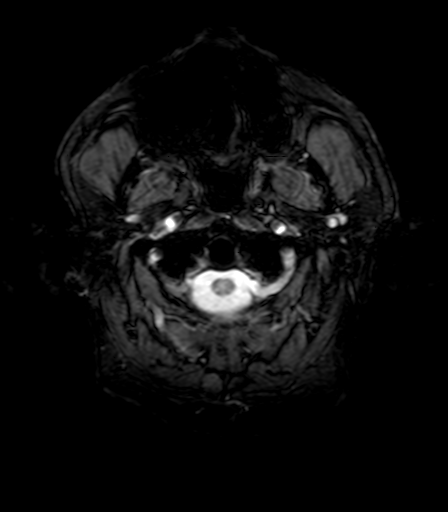
[im 26/26]
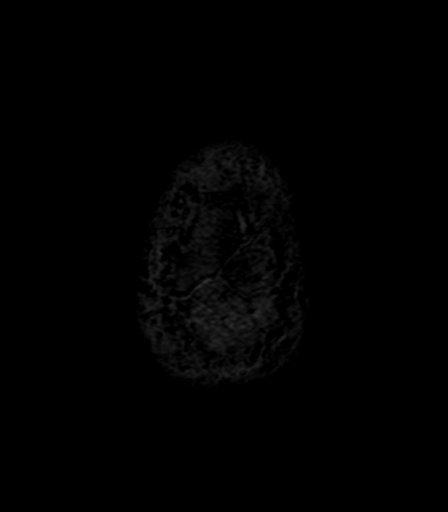

[48 of 48 positions shown; findings below may reference images not displayed]

FINDINGS: BRAIN PARENCHYMA:
*  No acute infarct.
*  Supratentorial white matter T2/FLAIR hyperintensities are nonspecific but statistically most likely to be related to chronic microangiopathic/senescent changes.
*  Mild global brain atrophy.
PITUITARY:
*   Unremarkable.
VASCULATURE:
*  Expected major intracranial flow voids are present.
VENTRICULAR SYSTEM:
*  No hydrocephalus or midline shift.
EXTRA-AXIAL SPACES:
*  No intra- or extra-axial fluid collections.
ORBITS:
PARANASAL SINUSES AND MASTOID AIR CELLS:
*  Predominantly clear.
BONES:
*  Unremarkable.
IMPRESSION: 1.  No acute intracranial abnormality.
2.  Mild global brain atrophy and findings suggestive of chronic microangiopathic changes.

## 2022-04-08 IMAGING — US LIVER ELASTOGRAPHY
1 series · 8 of 8 positions shown · non-contrast
Comparison: CT chest abdomen and pelvis 02/04/22
US ELASTOGRAPHY E9
10  shear wave measurements were acquired using a right intercostal approach with patient in a LLO position, with median of 2.02 m/s (range, 1.93 - 2.09 m/s). The IQR/median value is 2.9.
Measurements were obtained using a C1-6 transducer.

Images Obtained from [facility]
INDICATIONS:  Fatty change of liver

[Series 1: liver elastography · 8 of 8 slices shown]
[im 1/8]
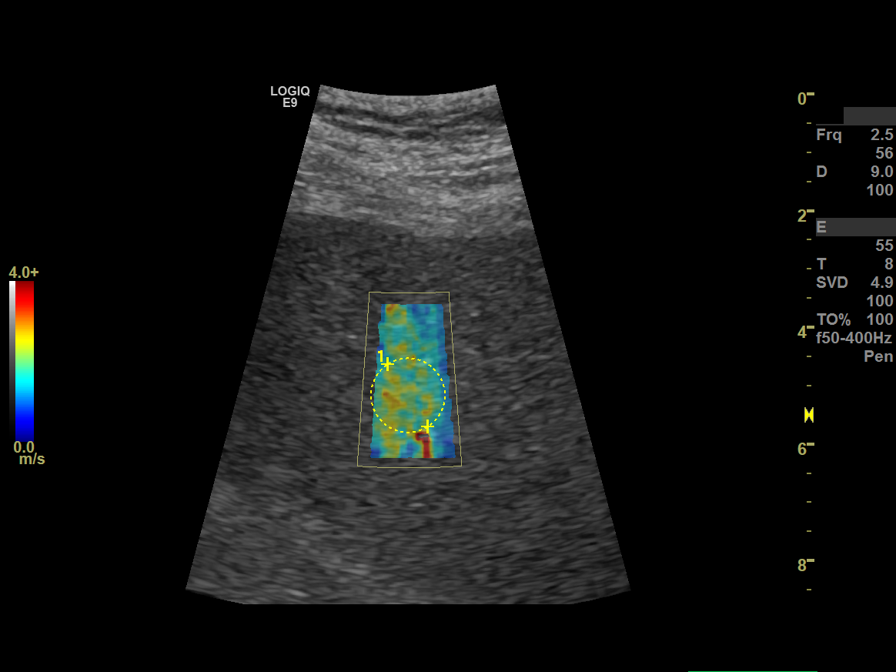
[im 2/8]
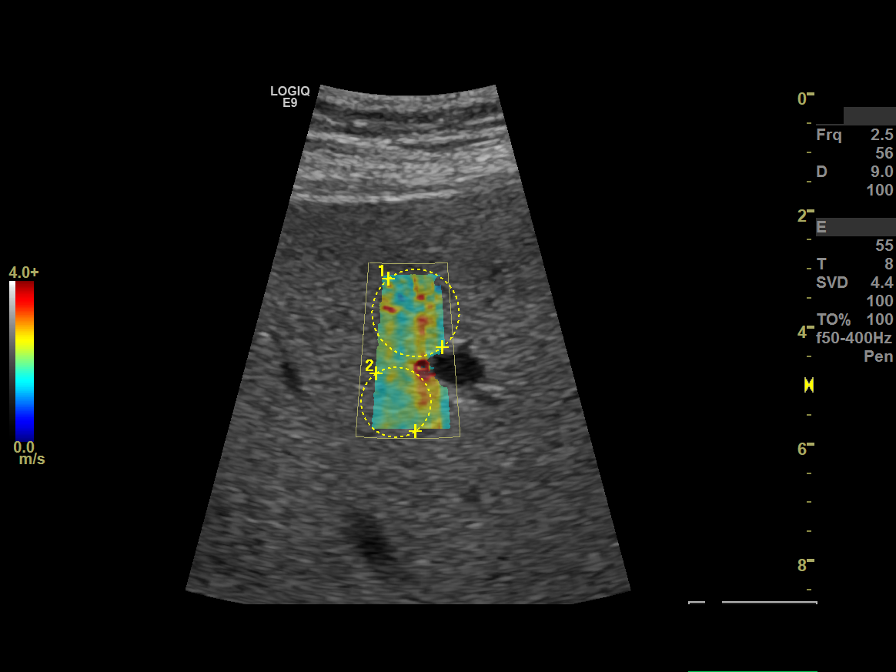
[im 3/8]
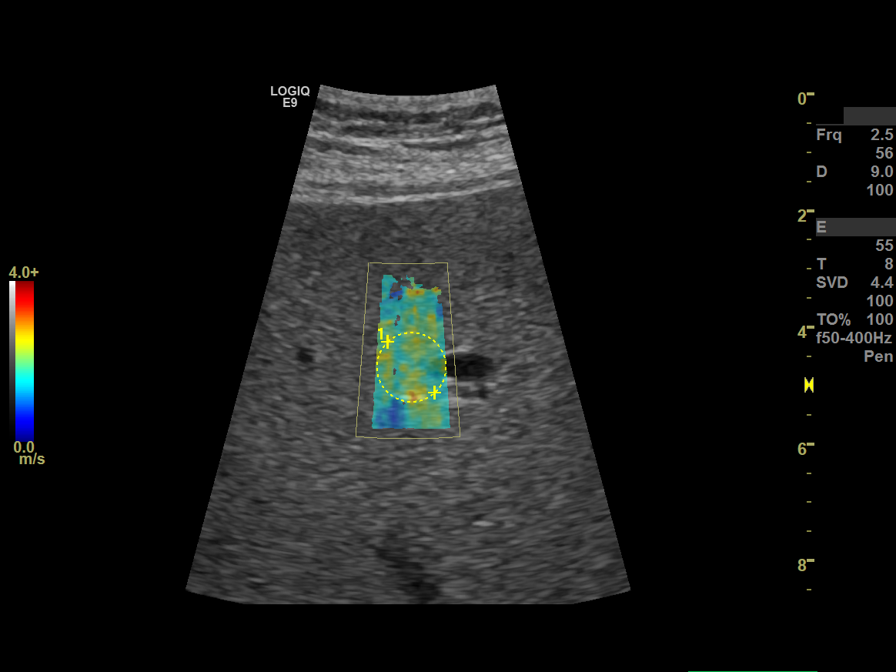
[im 4/8]
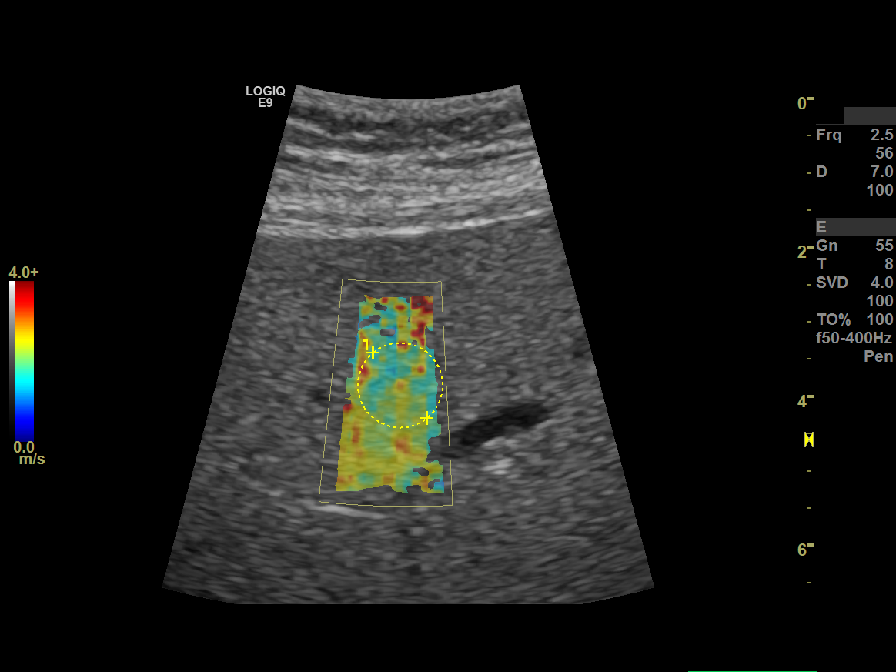
[im 5/8]
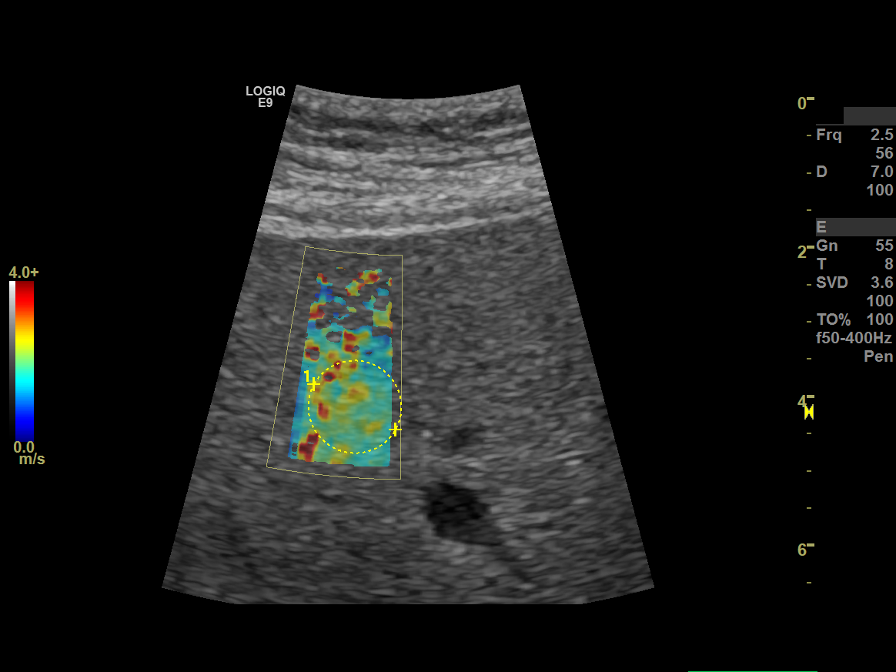
[im 6/8]
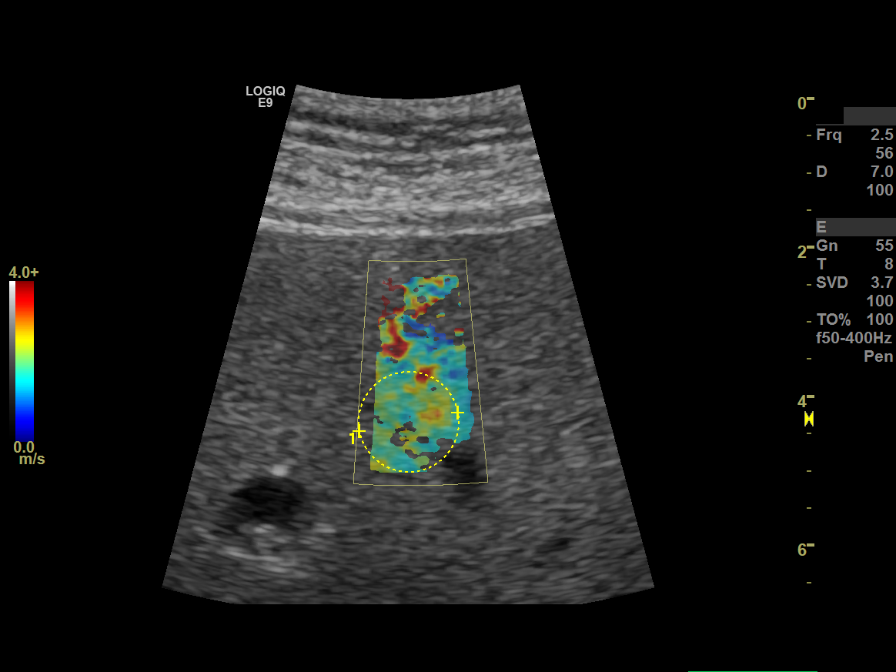
[im 7/8]
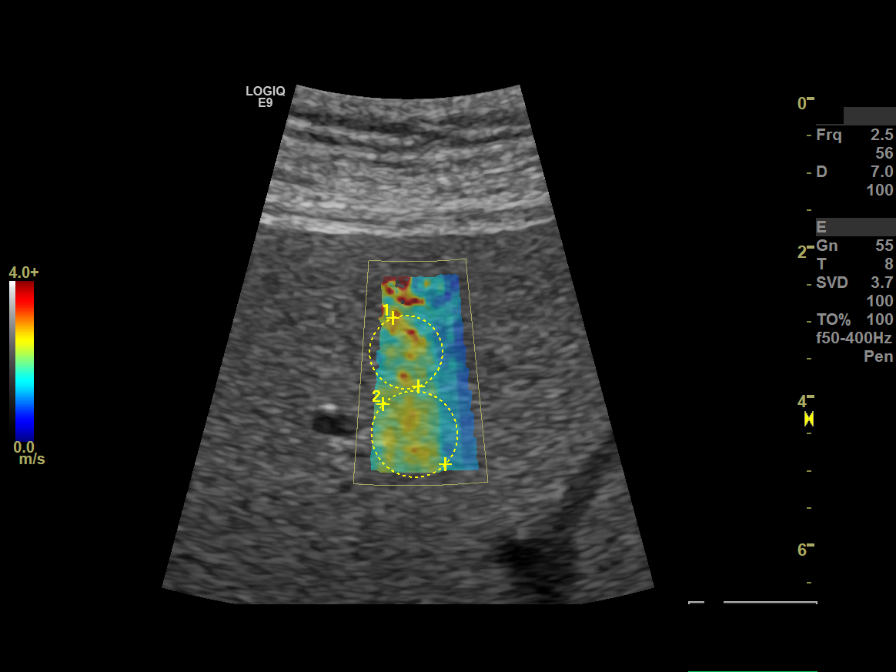
[im 8/8]
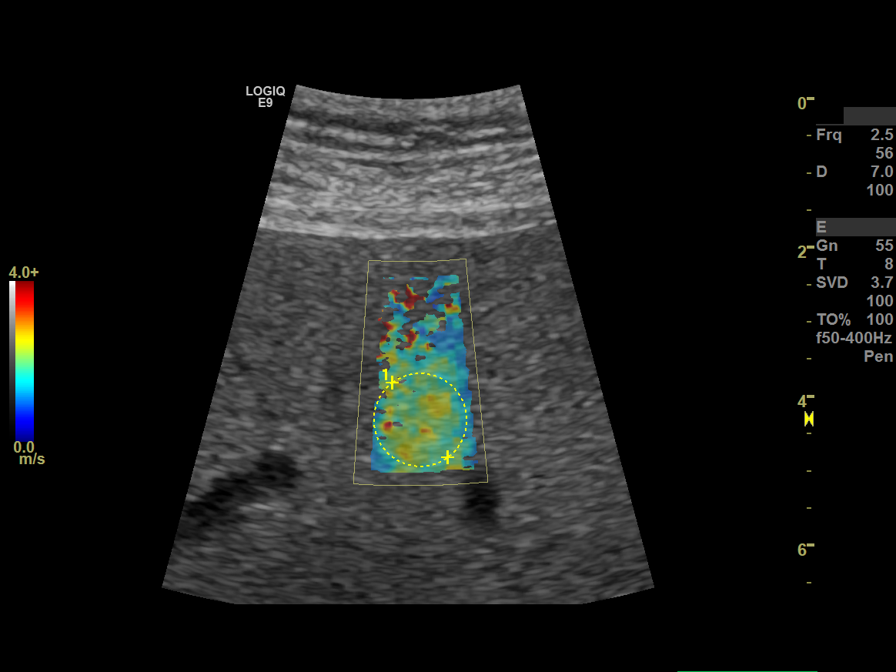

[8 of 8 positions shown; findings below may reference images not displayed]

IMPRESSION: 1. Cirrhosis, METAVIR score is F4.
2. Median liver shear wave speed = 2.02 m/s.
GE E9 Shear Wave Elastography Reference Ranges
Liver Fibrosis Staging:        METAVIR Score                    m/s
Normal-Mild                        F1                              1.35-1.66 m/s
Mild-Moderate                    F2                              1.66-1.77 m/s
Moderate-Severe                 F3                              1.77-1.99 m/s
Cirrhosis                             F4                                     >1.99 m/s
Note, ultrasound shear wave measurements may also be affected by presence of hepatic congestion, steatosis and inflammation. Shear wave speed measurements should be interpreted in conjunction with
other available clinical data, and they should not be considered interchangeable with MRI- derived stiffness measurements at this time.

## 2022-04-12 IMAGING — US US LIVER
1 series · 14 of 25 positions shown · non-contrast
Comparison: 09/28/17

Images Obtained from [facility]
HISTORY: 76-year-old male with fatty (change of) liver, not elsewhere classified.
TECHNIQUE: Using real-time and Doppler ultrasound, the liver and gallbladder were evaluated.

[Series 1: us liver · 14 of 35 slices shown]
[im 1/35]
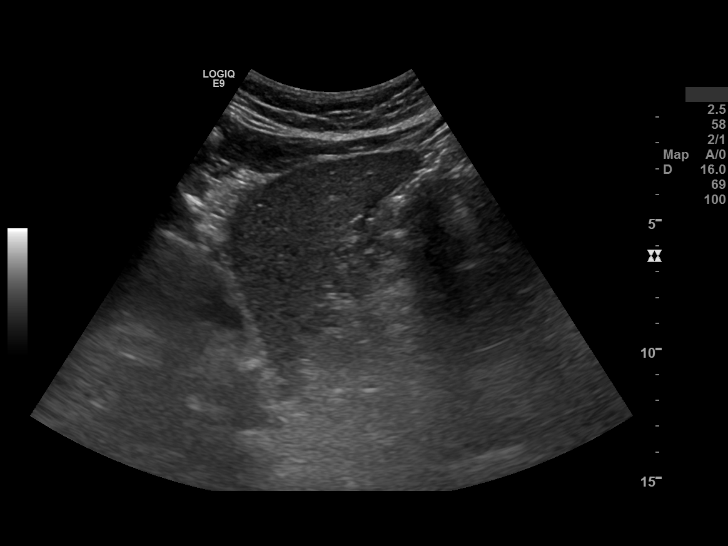
[im 3/35]
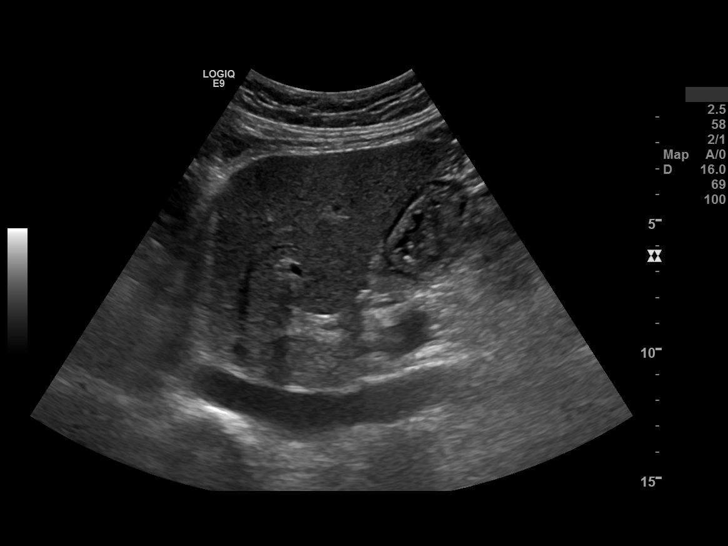
[im 6/35]
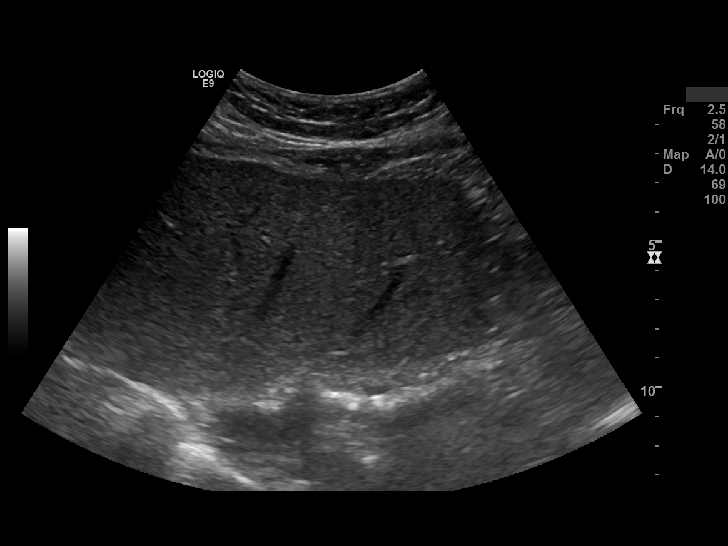
[im 9/35]
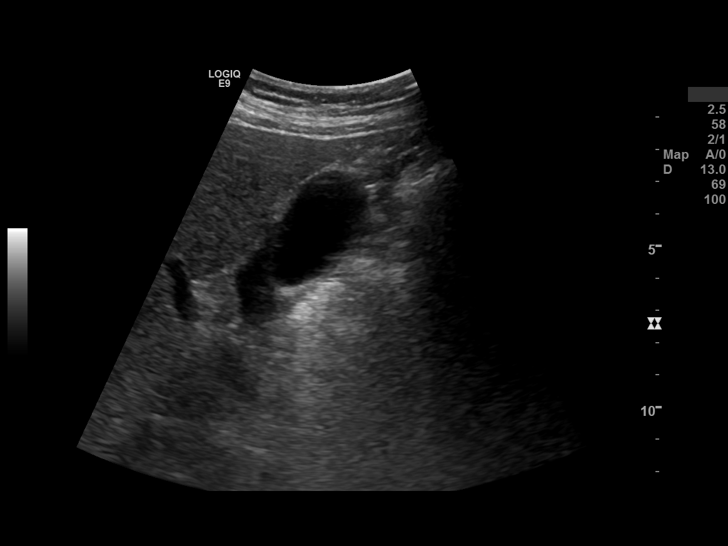
[im 12/35]
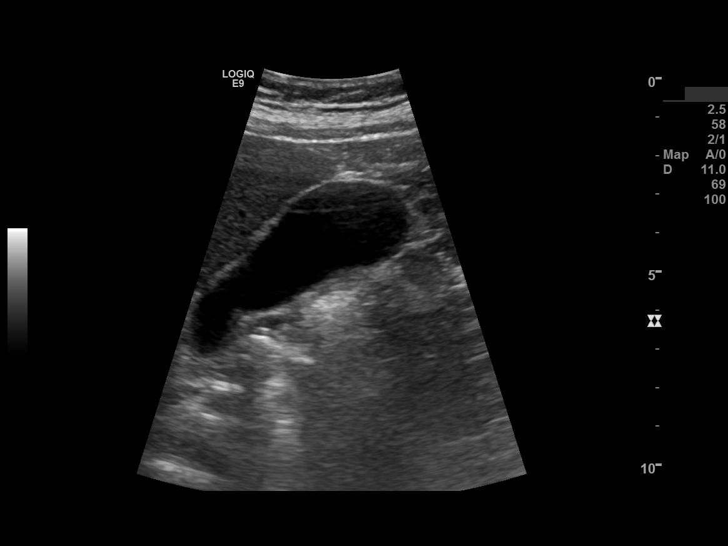
[im 13/35]
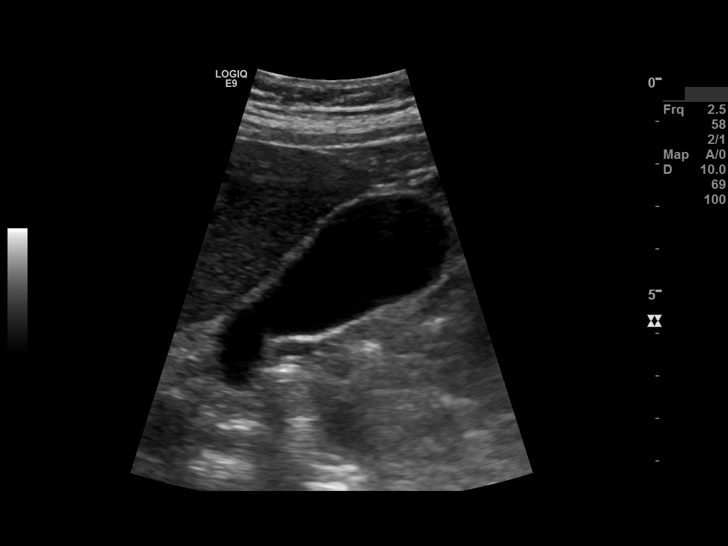
[im 16/35]
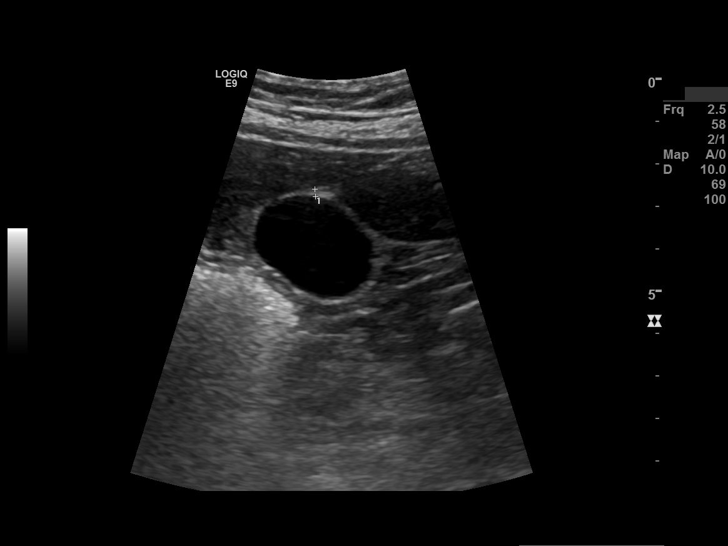
[im 19/35]
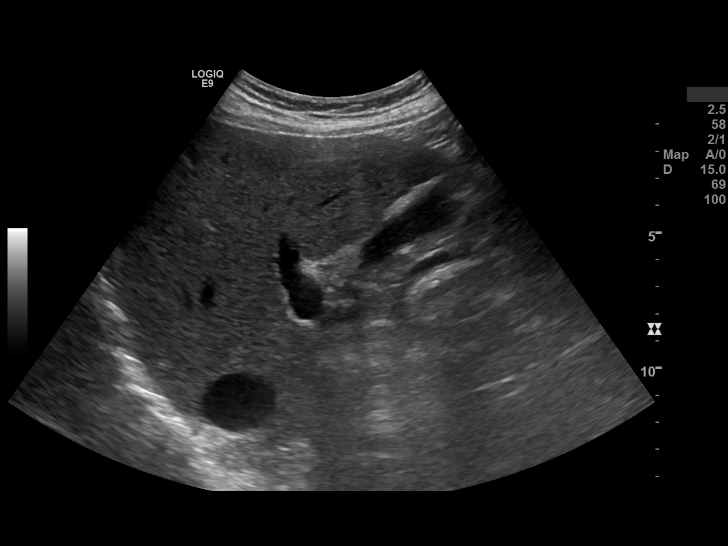
[im 22/35]
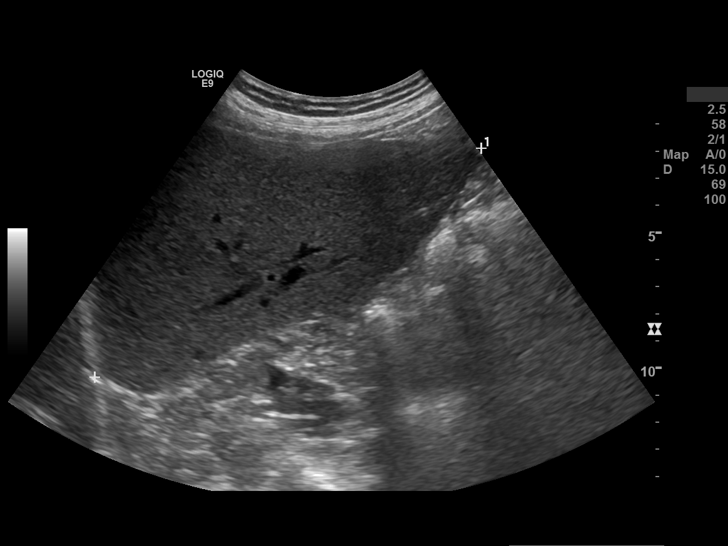
[im 23/35]
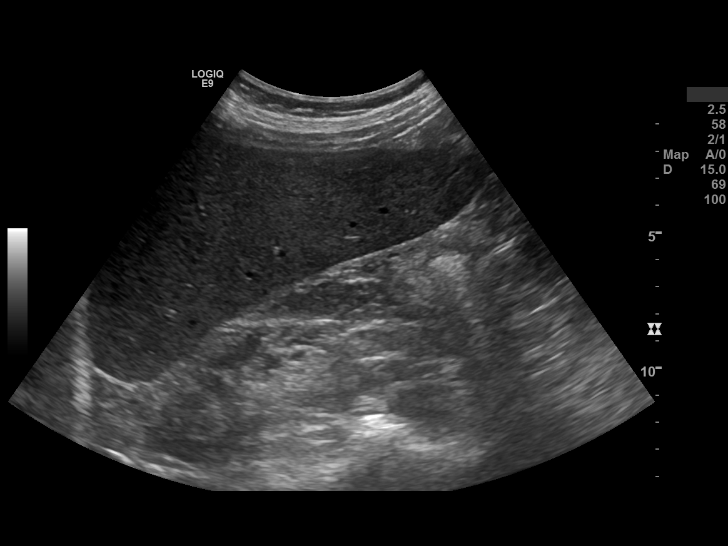
[im 26/35]
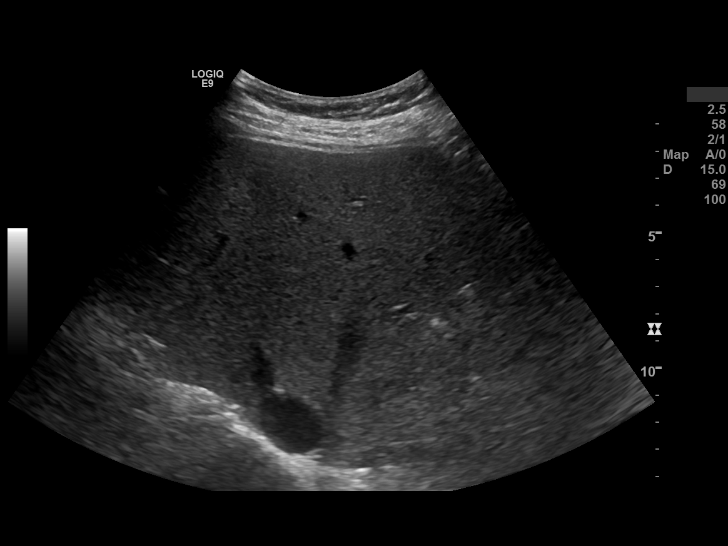
[im 29/35]
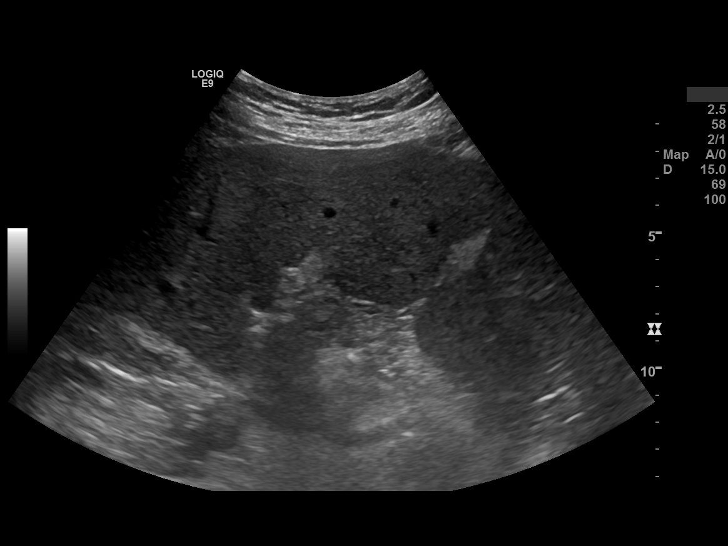
[im 32/35]
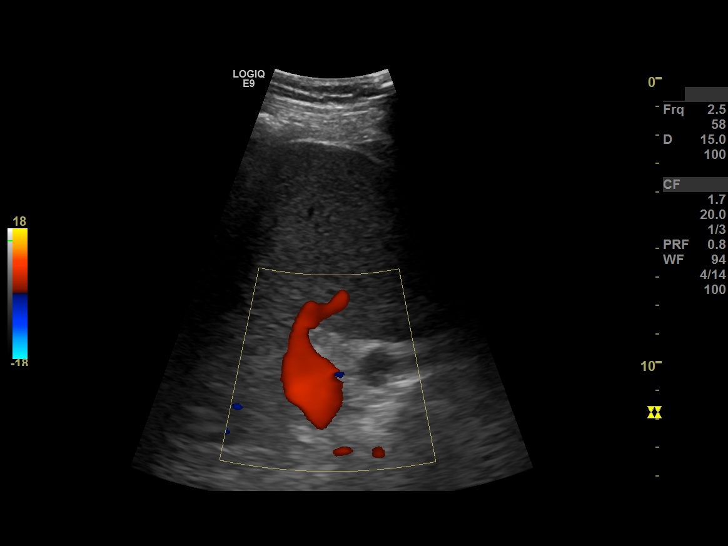
[im 35/35]
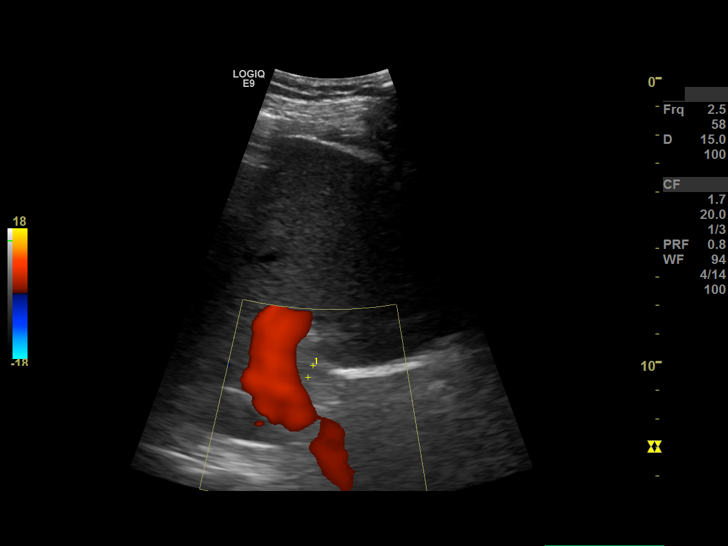

[14 of 25 positions shown; findings below may reference images not displayed]

FINDINGS: Liver: Length of  165 mm in the right midclavicular line. The liver echotexture is within normal limits. Hepatopetal flow is identified in the portal vein. The main portal vein diameter measures
mm. No focal masses identified. No intrahepatic biliary ductal dilatation is seen.
Gallbladder: The gallbladder is within normal limits. The gallbladder wall thickness measures 1.8 mm. There is a  negative sonographic Murphy's sign. The common bile duct measures 4.54  mm.
IMPRESSION: Negative ultrasound liver study.
